# Patient Record
Sex: Female | Born: 1975 | Race: White | Hispanic: Yes | Marital: Married | Smoking: Never smoker
Health system: Southern US, Community
[De-identification: ages and names within clinical notes are randomized; demographics above are authoritative.]

## PROBLEM LIST (undated history)

## (undated) DIAGNOSIS — E119 Type 2 diabetes mellitus without complications: Secondary | ICD-10-CM

## (undated) DIAGNOSIS — R000 Tachycardia, unspecified: Secondary | ICD-10-CM

## (undated) HISTORY — PX: CHOLECYSTECTOMY: SHX55

---

## 2017-06-26 ENCOUNTER — Emergency Department (HOSPITAL_BASED_OUTPATIENT_CLINIC_OR_DEPARTMENT_OTHER)
Admission: EM | Admit: 2017-06-26 | Discharge: 2017-06-26 | Disposition: A | Discharge status: Home / Self Care | Payer: No Typology Code available for payment source | Attending: Emergency Medicine | Admitting: Emergency Medicine

## 2017-06-26 ENCOUNTER — Emergency Department (HOSPITAL_BASED_OUTPATIENT_CLINIC_OR_DEPARTMENT_OTHER): Payer: No Typology Code available for payment source

## 2017-06-26 ENCOUNTER — Encounter (HOSPITAL_BASED_OUTPATIENT_CLINIC_OR_DEPARTMENT_OTHER): Payer: Self-pay | Admitting: *Deleted

## 2017-06-26 ENCOUNTER — Other Ambulatory Visit: Payer: Self-pay

## 2017-06-26 DIAGNOSIS — Z7984 Long term (current) use of oral hypoglycemic drugs: Secondary | ICD-10-CM | POA: Diagnosis not present

## 2017-06-26 DIAGNOSIS — I471 Supraventricular tachycardia: Secondary | ICD-10-CM

## 2017-06-26 DIAGNOSIS — R079 Chest pain, unspecified: Secondary | ICD-10-CM | POA: Insufficient documentation

## 2017-06-26 DIAGNOSIS — R002 Palpitations: Secondary | ICD-10-CM | POA: Diagnosis present

## 2017-06-26 DIAGNOSIS — R0602 Shortness of breath: Secondary | ICD-10-CM | POA: Diagnosis not present

## 2017-06-26 HISTORY — DX: Tachycardia, unspecified: R00.0

## 2017-06-26 LAB — COMPREHENSIVE METABOLIC PANEL
ALK PHOS: 49 U/L (ref 38–126)
ALT: 63 U/L — AB (ref 14–54)
AST: 41 U/L (ref 15–41)
Albumin: 4.3 g/dL (ref 3.5–5.0)
Anion gap: 8 (ref 5–15)
BILIRUBIN TOTAL: 0.9 mg/dL (ref 0.3–1.2)
BUN: 19 mg/dL (ref 6–20)
CHLORIDE: 107 mmol/L (ref 101–111)
CO2: 22 mmol/L (ref 22–32)
CREATININE: 0.86 mg/dL (ref 0.44–1.00)
Calcium: 9.3 mg/dL (ref 8.9–10.3)
GFR calc Af Amer: >60 mL/min (ref >60)
Glucose, Bld: 170 mg/dL — ABNORMAL HIGH (ref 65–99)
Potassium: 3.5 mmol/L (ref 3.5–5.1)
Sodium: 137 mmol/L (ref 135–145)
TOTAL PROTEIN: 7.5 g/dL (ref 6.5–8.1)

## 2017-06-26 LAB — CBC
HEMATOCRIT: 40.9 % (ref 36.0–46.0)
Hemoglobin: 14 g/dL (ref 12.0–15.0)
MCH: 28.8 pg (ref 26.0–34.0)
MCHC: 34.2 g/dL (ref 30.0–36.0)
MCV: 84.2 fL (ref 78.0–100.0)
Platelets: 361 10*3/uL (ref 150–400)
RBC: 4.86 MIL/uL (ref 3.87–5.11)
RDW: 12.5 % (ref 11.5–15.5)
WBC: 9.2 10*3/uL (ref 4.0–10.5)

## 2017-06-26 MED ORDER — SODIUM CHLORIDE 0.9 % IV BOLUS (SEPSIS)
1000.0000 mL | Freq: Once | INTRAVENOUS | Status: AC
  Administered 2017-06-26: 1000 mL via INTRAVENOUS

## 2017-06-26 NOTE — ED Notes (Signed)
Family at bedside. 

## 2017-06-26 NOTE — ED Notes (Signed)
ED Provider at bedside. 

## 2017-06-26 NOTE — ED Provider Notes (Signed)
[facility] EMERGENCY DEPARTMENT Provider Note   CSN: [id number] Arrival date & time: 06/26/17  2035     History   Chief Complaint Chief Complaint  Patient presents with  . Palpitations    HPI April Mcconnell is a 41 y.o. female.  HPI  41 year old female presents with palpitations.  This started around 5 PM tonight.  She was laying in bed when it started.  She had some chest pain shortness of breath for less than 45 minutes but it has resolved.  She feels the palpitations but denies any current dizziness or lightheadedness.  No recent change in medicines.  Denies any significant caffeine use and denies smoking.  She has a history of diabetes.  States about a month ago she had a similar episode but it resolved on its own.  No known history of an arrhythmia. Has been working out more than typical recently. Friend is an EMT and her BP was in 90s when she checked with HR 200s so she brought her in to the ED.  Past Medical History:  Diagnosis Date  . Tachycardia, unspecified     There are no active problems to display for this patient.   History reviewed. No pertinent surgical history.  OB History    No data available       Home Medications    Prior to Admission medications   Medication Sig Start Date End Date Taking? Authorizing Provider  Dulaglutide (TRULICITY [place]) Inject into the skin.   Yes [provider]  metFORMIN (GLUMETZA) 1000 MG (MOD) 24 hr tablet Take 1,000 mg by mouth 2 (two) times daily with a meal.   Yes [provider]    Family History History reviewed. No pertinent family history.  Social History Social History   Tobacco Use  . Smoking status: Never Smoker  . Smokeless tobacco: Never Used  Substance Use Topics  . Alcohol use: No    Frequency: Never  . Drug use: No     Allergies   Patient has no known allergies.   Review of Systems Review of Systems  Respiratory: Positive for shortness of breath.     Cardiovascular: Positive for chest pain and palpitations.  Gastrointestinal: Negative for vomiting.  Neurological: Positive for dizziness.  All other systems reviewed and are negative.    Physical Exam Updated Vital Signs BP (!) 97/58   Pulse 94   Temp (!) 97.5 F (36.4 C) (Oral)   Resp 18   Ht 5\' 5"  (1.651 m)   Wt 74.4 kg (164 lb)   LMP 05/25/2017   SpO2 99%   BMI 27.29 kg/m   Physical Exam  Constitutional: She is oriented to person, place, and time. She appears well-developed and well-nourished. No distress.  HENT:  Head: Normocephalic and atraumatic.  Right Ear: External ear normal.  Left Ear: External ear normal.  Nose: Nose normal.  Eyes: Right eye exhibits no discharge. Left eye exhibits no discharge.  Cardiovascular: Regular rhythm, normal heart sounds and intact distal pulses. Tachycardia present.  HR 200  Pulmonary/Chest: Effort normal and breath sounds normal.  Abdominal: Soft. There is no tenderness.  Neurological: She is alert and oriented to person, place, and time.  Skin: Skin is warm and dry. She is not diaphoretic.  Nursing note and vitals reviewed.    ED Treatments / Results  Labs (all labs ordered are listed, but only abnormal results are displayed) Labs Reviewed  COMPREHENSIVE METABOLIC PANEL - Abnormal; Notable for the following components:  Result Value   Glucose, Bld 170 (*)    ALT 63 (*)    All other components within normal limits  CBC    EKG  EKG Interpretation  Date/Time:  Wednesday June 26 2017 20:39:38 EST Ventricular Rate:  192 PR Interval:    QRS Duration: 92 QT Interval:  258 QTC Calculation: 462 R Axis:   77 Text Interpretation:  Supraventricular tachycardia ST depression, probably rate related Baseline wander in lead(s) V4 No old tracing to compare Confirmed by Pricilla LovelessGoldston, Sennie Borden [phone]([id number]) on 06/26/2017 8:49:08 PM       EKG Interpretation  Date/Time:  Wednesday June 26 2017 20:45:23 EST Ventricular Rate:   105 PR Interval:    QRS Duration: 92 QT Interval:  328 QTC Calculation: 434 R Axis:   90 Text Interpretation:  Sinus tachycardia Borderline right axis deviation SVT resolved Confirmed by Pricilla LovelessGoldston, Eirik Schueler [phone]([id number]) on 06/26/2017 9:11:16 PM        Radiology Dg Chest Portable 1 View  Result Date: 06/26/2017 CLINICAL DATA:  Pt c/o palpations, SOB  x 4 hrs   Hx tachycardia EXAM: PORTABLE CHEST 1 VIEW COMPARISON:  None. FINDINGS: The heart size and mediastinal contours are within normal limits. Both lungs are clear. The visualized skeletal structures are unremarkable. IMPRESSION: No active disease. Electronically Signed   By: Norva PavlovElizabeth  Brown M.D.   On: 06/26/2017 21:03    Procedures Procedures (including critical care time)  Medications Ordered in ED Medications  sodium chloride 0.9 % bolus 1,000 mL (0 mLs Intravenous Stopped 06/26/17 2205)     Initial Impression / Assessment and Plan / ED Course  I have reviewed the triage vital signs and the nursing notes.  Pertinent labs & imaging results that were available during my care of the patient were reviewed by me and considered in my medical decision making (see chart for details).     Patient presents with SVT.  She has symptomatic palpitations but no other current chest pain or shortness of breath or dizziness.  After the REVERT maneuver was performed, she converted to sinus rhythm.  She has stayed in sinus rhythm since and her palpitations have resolved.  Sounds like she probably had an episode of SVT about a month ago.  At this point, her blood pressures are low normal in the 90s.  She states this is typical for her.  Thus we will hold off on putting her on a beta-blocker and have her follow-up with cardiology.  Lab work unremarkable.  My suspicion for ACS or PE is very low.  Discharge with return precautions.  Final Clinical Impressions(s) / ED Diagnoses   Final diagnoses:  SVT (supraventricular tachycardia) [facility](HCC)    ED Discharge  Orders    None       Pricilla LovelessGoldston, Indiyah Paone, MD 06/26/17 [phone]

## 2017-06-26 NOTE — ED Notes (Signed)
Portable xray at bedside.

## 2017-06-26 NOTE — ED Triage Notes (Signed)
Pt c/o palpations, SOB  x 4 hrs

## 2019-02-01 ENCOUNTER — Other Ambulatory Visit: Payer: Self-pay

## 2019-02-01 ENCOUNTER — Emergency Department (HOSPITAL_BASED_OUTPATIENT_CLINIC_OR_DEPARTMENT_OTHER): Payer: No Typology Code available for payment source

## 2019-02-01 ENCOUNTER — Encounter (HOSPITAL_BASED_OUTPATIENT_CLINIC_OR_DEPARTMENT_OTHER): Payer: Self-pay | Admitting: Emergency Medicine

## 2019-02-01 ENCOUNTER — Emergency Department (HOSPITAL_BASED_OUTPATIENT_CLINIC_OR_DEPARTMENT_OTHER)
Admission: EM | Admit: 2019-02-01 | Discharge: 2019-02-01 | Disposition: A | Discharge status: Home / Self Care | Payer: No Typology Code available for payment source | Attending: Emergency Medicine | Admitting: Emergency Medicine

## 2019-02-01 DIAGNOSIS — Z79899 Other long term (current) drug therapy: Secondary | ICD-10-CM | POA: Insufficient documentation

## 2019-02-01 DIAGNOSIS — K8020 Calculus of gallbladder without cholecystitis without obstruction: Secondary | ICD-10-CM | POA: Insufficient documentation

## 2019-02-01 DIAGNOSIS — U071 COVID-19: Secondary | ICD-10-CM | POA: Diagnosis not present

## 2019-02-01 DIAGNOSIS — Z7984 Long term (current) use of oral hypoglycemic drugs: Secondary | ICD-10-CM | POA: Insufficient documentation

## 2019-02-01 DIAGNOSIS — E119 Type 2 diabetes mellitus without complications: Secondary | ICD-10-CM | POA: Insufficient documentation

## 2019-02-01 DIAGNOSIS — R1010 Upper abdominal pain, unspecified: Secondary | ICD-10-CM | POA: Diagnosis present

## 2019-02-01 HISTORY — DX: Type 2 diabetes mellitus without complications: E11.9

## 2019-02-01 LAB — URINALYSIS, ROUTINE W REFLEX MICROSCOPIC
Bilirubin Urine: NEGATIVE
Glucose, UA: NEGATIVE mg/dL
Ketones, ur: NEGATIVE mg/dL
Leukocytes,Ua: NEGATIVE
Nitrite: NEGATIVE
Protein, ur: NEGATIVE mg/dL
Specific Gravity, Urine: >1.03 — ABNORMAL HIGH (ref 1.005–1.030)
pH: 6 (ref 5.0–8.0)

## 2019-02-01 LAB — CBC WITH DIFFERENTIAL/PLATELET
Abs Immature Granulocytes: 0.02 10*3/uL (ref 0.00–0.07)
Basophils Absolute: 0 10*3/uL (ref 0.0–0.1)
Basophils Relative: 0 %
Eosinophils Absolute: 0.1 10*3/uL (ref 0.0–0.5)
Eosinophils Relative: 3 %
HCT: 40.7 % (ref 36.0–46.0)
Hemoglobin: 14.1 g/dL (ref 12.0–15.0)
Immature Granulocytes: 0 %
Lymphocytes Relative: 24 %
Lymphs Abs: 1.2 10*3/uL (ref 0.7–4.0)
MCH: 30.3 pg (ref 26.0–34.0)
MCHC: 34.6 g/dL (ref 30.0–36.0)
MCV: 87.3 fL (ref 80.0–100.0)
Monocytes Absolute: 0.6 10*3/uL (ref 0.1–1.0)
Monocytes Relative: 13 %
Neutro Abs: 3.1 10*3/uL (ref 1.7–7.7)
Neutrophils Relative %: 60 %
Platelets: 187 10*3/uL (ref 150–400)
RBC: 4.66 MIL/uL (ref 3.87–5.11)
RDW: 12 % (ref 11.5–15.5)
WBC: 5 10*3/uL (ref 4.0–10.5)
nRBC: 0 % (ref 0.0–0.2)

## 2019-02-01 LAB — COMPREHENSIVE METABOLIC PANEL
ALT: 86 U/L — ABNORMAL HIGH (ref 0–44)
AST: 43 U/L — ABNORMAL HIGH (ref 15–41)
Albumin: 4 g/dL (ref 3.5–5.0)
Alkaline Phosphatase: 55 U/L (ref 38–126)
Anion gap: 9 (ref 5–15)
BUN: 13 mg/dL (ref 6–20)
CO2: 24 mmol/L (ref 22–32)
Calcium: 8.6 mg/dL — ABNORMAL LOW (ref 8.9–10.3)
Chloride: 105 mmol/L (ref 98–111)
Creatinine, Ser: 0.68 mg/dL (ref 0.44–1.00)
GFR calc Af Amer: >60 mL/min (ref >60)
GFR calc non Af Amer: >60 mL/min (ref >60)
Glucose, Bld: 165 mg/dL — ABNORMAL HIGH (ref 70–99)
Potassium: 3.6 mmol/L (ref 3.5–5.1)
Sodium: 138 mmol/L (ref 135–145)
Total Bilirubin: 0.7 mg/dL (ref 0.3–1.2)
Total Protein: 6.8 g/dL (ref 6.5–8.1)

## 2019-02-01 LAB — URINALYSIS, MICROSCOPIC (REFLEX)

## 2019-02-01 LAB — PREGNANCY, URINE: Preg Test, Ur: NEGATIVE

## 2019-02-01 LAB — LIPASE, BLOOD: Lipase: 34 U/L (ref 11–51)

## 2019-02-01 LAB — SARS CORONAVIRUS 2 AG (30 MIN TAT): SARS Coronavirus 2 Ag: POSITIVE — AB

## 2019-02-01 MED ORDER — AMOXICILLIN-POT CLAVULANATE 875-125 MG PO TABS: 1.0000 | ORAL_TABLET | Freq: Two times a day (BID) | ORAL | 0 refills | Status: DC

## 2019-02-01 MED ORDER — ONDANSETRON 8 MG PO TBDP: 8.0000 mg | ORAL_TABLET | Freq: Three times a day (TID) | ORAL | 0 refills | Status: DC | PRN

## 2019-02-01 MED ORDER — HYDROCODONE-ACETAMINOPHEN 5-325 MG PO TABS: 1.0000 | ORAL_TABLET | ORAL | 0 refills | Status: DC | PRN

## 2019-02-01 MED ORDER — HYDROMORPHONE HCL 1 MG/ML IJ SOLN
1.0000 mg | Freq: Once | INTRAMUSCULAR | Status: AC
  Administered 2019-02-01: 1 mg via INTRAVENOUS
  Filled 2019-02-01: qty 1

## 2019-02-01 MED ORDER — MORPHINE SULFATE (PF) 4 MG/ML IV SOLN
4.0000 mg | Freq: Once | INTRAVENOUS | Status: AC
  Administered 2019-02-01: 4 mg via INTRAVENOUS
  Filled 2019-02-01: qty 1

## 2019-02-01 MED ORDER — SODIUM CHLORIDE 0.9 % IV SOLN
2.0000 g | Freq: Once | INTRAVENOUS | Status: AC
  Administered 2019-02-01: 11:00:00 2 g via INTRAVENOUS
  Filled 2019-02-01: qty 20

## 2019-02-01 MED ORDER — SODIUM CHLORIDE 0.9 % IV SOLN
INTRAVENOUS | Status: DC | PRN
  Administered 2019-02-01: 250 mL via INTRAVENOUS

## 2019-02-01 MED ORDER — ONDANSETRON HCL 4 MG/2ML IJ SOLN
4.0000 mg | Freq: Once | INTRAMUSCULAR | Status: AC
  Administered 2019-02-01: 4 mg via INTRAVENOUS
  Filled 2019-02-01: qty 2

## 2019-02-01 NOTE — ED Notes (Signed)
Informed MD of pain 8/10

## 2019-02-01 NOTE — ED Notes (Signed)
ED Provider at bedside. 

## 2019-02-01 NOTE — ED Notes (Signed)
Patient transported to Ultrasound 

## 2019-02-01 NOTE — ED Notes (Signed)
Awaiting US.

## 2019-02-01 NOTE — Discharge Instructions (Addendum)
Call the general surgery office for follow up  Return to the ER as needed for new or worsening symptoms  Take the medication as prescribed

## 2019-02-01 NOTE — ED Triage Notes (Signed)
RUQ pain x 3 hrs, radiates to epigastric area and back, with nausea. Ate sausage dog for supper last night .Denies urinary s/s

## 2019-02-01 NOTE — ED Provider Notes (Signed)
[facility] EMERGENCY DEPARTMENT Provider Note   CSN: [id number] Arrival date & time: 02/01/19  0810     History   Chief Complaint Chief Complaint  Patient presents with  . Abdominal Pain    HPI April Mcconnell is a 43 y.o. female.     HPI Patient is a 43 year old female presents the emergency department acute onset right upper quadrant abdominal pain with associated nausea vomiting which began approximately 5 AM this morning.  Patient states she had similar symptoms several weeks ago but no recurrent pain since then.  No fevers or chills.  Her pain is severe in severity and constant at this time.  She does report eating sausage last night which is atypical for her no recent cough or congestion.  No recent illness.   Past Medical History:  Diagnosis Date  . Diabetes mellitus without complication (HCC)   . Tachycardia, unspecified     There are no active problems to display for this patient.   History reviewed. No pertinent surgical history.   OB History   No obstetric history on file.      Home Medications    Prior to Admission medications   Medication Sig Start Date End Date Taking? Authorizing Provider  Dulaglutide (TRULICITY [place]) Inject into the skin.   Yes [provider]  metFORMIN (GLUMETZA) 1000 MG (MOD) 24 hr tablet Take 1,000 mg by mouth 2 (two) times daily with a meal.   Yes [provider]  amoxicillin-clavulanate (AUGMENTIN) 875-125 MG tablet Take 1 tablet by mouth every 12 (twelve) hours. 02/01/19   Azalia Bilisampos, Nylani Michetti, MD  HYDROcodone-acetaminophen (NORCO/VICODIN) 5-325 MG tablet Take 1 tablet by mouth every 4 (four) hours as needed for moderate pain. 02/01/19   Azalia Bilisampos, Myasia Sinatra, MD  ondansetron (ZOFRAN ODT) 8 MG disintegrating tablet Take 1 tablet (8 mg total) by mouth every 8 (eight) hours as needed for nausea or vomiting. 02/01/19   Azalia Bilisampos, Amayrany Cafaro, MD    Family History No family history on file.  Social History Social History   Tobacco Use  . Smoking status: Never Smoker  . Smokeless tobacco: Never Used  Substance Use Topics  . Alcohol use: No    Frequency: Never  . Drug use: No     Allergies   Patient has no known allergies.   Review of Systems Review of Systems  All other systems reviewed and are negative.    Physical Exam Updated Vital Signs BP 104/74 (BP Location: Right Arm)   Pulse 77   Temp 98.2 F (36.8 C) (Oral)   Resp 18   Ht 5\' 4"  (1.626 m)   Wt 60.3 kg   LMP 01/05/2019   SpO2 100%   BMI 22.83 kg/m   Physical Exam Vitals signs and nursing note reviewed.  Constitutional:      Appearance: She is well-developed.     Comments: Uncomfortable appearing  HENT:     Head: Normocephalic and atraumatic.  Neck:     Musculoskeletal: Normal range of motion.  Cardiovascular:     Rate and Rhythm: Normal rate and regular rhythm.     Heart sounds: Normal heart sounds.  Pulmonary:     Effort: Pulmonary effort is normal.     Breath sounds: Normal breath sounds.  Abdominal:     General: There is no distension.     Palpations: Abdomen is soft.     Tenderness: There is no abdominal tenderness.  Musculoskeletal: Normal range of motion.  Skin:    General: Skin  is warm and dry.  Neurological:     Mental Status: She is alert and oriented to person, place, and time.  Psychiatric:        Judgment: Judgment normal.      ED Treatments / Results  Labs (all labs ordered are listed, but only abnormal results are displayed) Labs Reviewed  SARS CORONAVIRUS 2 (HOSP ORDER, PERFORMED IN [facility] VIA [facility] ID) - Abnormal; Notable for the following components:      Result Value   SARS Coronavirus 2 ([facility] ID Now) POSITIVE (*)    All other components within normal limits  URINALYSIS, ROUTINE W REFLEX MICROSCOPIC - Abnormal; Notable for the following components:   Specific Gravity, Urine >1.030 (*)    Hgb urine dipstick TRACE (*)    All other components within normal limits   COMPREHENSIVE METABOLIC PANEL - Abnormal; Notable for the following components:   Glucose, Bld 165 (*)    Calcium 8.6 (*)    AST 43 (*)    ALT 86 (*)    All other components within normal limits  URINALYSIS, MICROSCOPIC (REFLEX) - Abnormal; Notable for the following components:   Bacteria, UA RARE (*)    All other components within normal limits  PREGNANCY, URINE  CBC WITH DIFFERENTIAL/PLATELET  LIPASE, BLOOD    EKG None  Radiology [place] Abdomen Limited Ruq  Result Date: 02/01/2019 CLINICAL DATA:  Acute epigastric abdominal pain. EXAM: ULTRASOUND ABDOMEN LIMITED RIGHT UPPER QUADRANT COMPARISON:  None. FINDINGS: Gallbladder: Cholelithiasis is noted with largest stone measuring 1.7 cm. No gallbladder wall thickening is noted. Small amount of pericholecystic fluid is noted. Positive sonographic Murphy's sign is noted. Mild amount of sludge is noted within the gallbladder lumen. Probable 1.8 cm polyp is noted which demonstrates color flow. Common bile duct: Diameter: 2 mm which is within normal limits. Liver: No focal lesion identified. Increased echogenicity of hepatic parenchyma is noted suggesting hepatic steatosis. Portal vein is patent on color Doppler imaging with normal direction of blood flow towards the liver. IMPRESSION: Cholelithiasis is noted as well as sludge with small amount of pericholecystic fluid and positive sonographic Murphy's sign. This is concerning for possible cholecystitis, and HIDA scan may be performed for further evaluation. Probable 1.8 cm polyp is noted which demonstrates color flow. Given the size, malignancy cannot be excluded and correlation with surgery is recommended. Probable hepatic steatosis. Electronically Signed   By: Lupita RaiderJames  Green Jr M.D.   On: 02/01/2019 10:13    Procedures Procedures (including critical care time)  Medications Ordered in ED Medications  cefTRIAXone (ROCEPHIN) 2 g in sodium chloride 0.9 % 100 mL IVPB (2 g Intravenous New Bag/Given 02/01/19  1039)  0.9 %  sodium chloride infusion (250 mLs Intravenous New Bag/Given 02/01/19 1038)  ondansetron (ZOFRAN) injection 4 mg (4 mg Intravenous Given 02/01/19 0846)  morphine 4 MG/ML injection 4 mg (4 mg Intravenous Given 02/01/19 0847)  HYDROmorphone (DILAUDID) injection 1 mg (1 mg Intravenous Given 02/01/19 0919)     Initial Impression / Assessment and Plan / ED Course  I have reviewed the triage vital signs and the nursing notes.  Pertinent labs & imaging results that were available during my care of the patient were reviewed by me and considered in my medical decision making (see chart for details).       Resolution of patient's pain here in the emergency department.  Her symptoms are more consistent with biliary colic with noted gallstones.  There is a very small amount of  pericholecystic fluid without gallbladder wall thickening.  Common bile duct is normal.  Mild elevation in her LFTs.  Case was discussed with central surgery Dr. Ninfa Linden who agrees the patient is able to be followed up closely in the office this week and does recommend initiation of Augmentin.  No frank right upper quadrant tenderness on exam at this time.  No Murphy sign on my examination.  Preemptive COVID-19 swab obtained  11:06 AM I was informed from nursing staff that the patient is positive for corona virus here in the emergency department.  She likely has an asymptomatic carrier as she has no fever or upper respiratory symptoms.  She has been given home quarantine instructions for 14 days.  She is been instructed to call the [place] surgery office tomorrow for close follow-up regarding her gallstones but also to let them know that she has tested positive for corona virus.  She understands that this may delay her follow-up.  She understands in the interim to return to emergency department for new or worsening symptoms and to inform them of her positive COVID-19 test    Final Clinical Impressions(s) / ED  Diagnoses   Final diagnoses:  Upper abdominal pain  Calculus of gallbladder without cholecystitis without obstruction  COVID-19 virus detected    ED Discharge Orders         Ordered    ondansetron (ZOFRAN ODT) 8 MG disintegrating tablet  Every 8 hours PRN     02/01/19 1048    amoxicillin-clavulanate (AUGMENTIN) 875-125 MG tablet  Every 12 hours     02/01/19 1048    HYDROcodone-acetaminophen (NORCO/VICODIN) 5-325 MG tablet  Every 4 hours PRN     02/01/19 1048           Jola Schmidt, MD 02/01/19 1107

## 2019-03-15 ENCOUNTER — Encounter (HOSPITAL_COMMUNITY): Payer: Self-pay | Admitting: Emergency Medicine

## 2019-03-15 ENCOUNTER — Emergency Department (HOSPITAL_COMMUNITY)
Admission: EM | Admit: 2019-03-15 | Discharge: 2019-03-15 | Disposition: A | Discharge status: Home / Self Care | Payer: Medicare (Managed Care) | Attending: Emergency Medicine | Admitting: Emergency Medicine

## 2019-03-15 ENCOUNTER — Other Ambulatory Visit: Payer: Self-pay

## 2019-03-15 ENCOUNTER — Emergency Department (HOSPITAL_COMMUNITY): Payer: Medicare (Managed Care)

## 2019-03-15 DIAGNOSIS — Z7984 Long term (current) use of oral hypoglycemic drugs: Secondary | ICD-10-CM | POA: Insufficient documentation

## 2019-03-15 DIAGNOSIS — Z79899 Other long term (current) drug therapy: Secondary | ICD-10-CM | POA: Diagnosis not present

## 2019-03-15 DIAGNOSIS — Z8619 Personal history of other infectious and parasitic diseases: Secondary | ICD-10-CM | POA: Diagnosis not present

## 2019-03-15 DIAGNOSIS — E119 Type 2 diabetes mellitus without complications: Secondary | ICD-10-CM | POA: Diagnosis not present

## 2019-03-15 DIAGNOSIS — R1011 Right upper quadrant pain: Secondary | ICD-10-CM

## 2019-03-15 DIAGNOSIS — K8050 Calculus of bile duct without cholangitis or cholecystitis without obstruction: Secondary | ICD-10-CM | POA: Insufficient documentation

## 2019-03-15 LAB — URINALYSIS, ROUTINE W REFLEX MICROSCOPIC
Bilirubin Urine: NEGATIVE
Glucose, UA: NEGATIVE mg/dL
Hgb urine dipstick: NEGATIVE
Ketones, ur: NEGATIVE mg/dL
Leukocytes,Ua: NEGATIVE
Nitrite: NEGATIVE
Protein, ur: NEGATIVE mg/dL
Specific Gravity, Urine: 1.018 (ref 1.005–1.030)
pH: 6 (ref 5.0–8.0)

## 2019-03-15 LAB — CBC
HCT: 39.9 % (ref 36.0–46.0)
Hemoglobin: 14.1 g/dL (ref 12.0–15.0)
MCH: 30.5 pg (ref 26.0–34.0)
MCHC: 35.3 g/dL (ref 30.0–36.0)
MCV: 86.2 fL (ref 80.0–100.0)
Platelets: 312 10*3/uL (ref 150–400)
RBC: 4.63 MIL/uL (ref 3.87–5.11)
RDW: 12.2 % (ref 11.5–15.5)
WBC: 6 10*3/uL (ref 4.0–10.5)
nRBC: 0 % (ref 0.0–0.2)

## 2019-03-15 LAB — COMPREHENSIVE METABOLIC PANEL
ALT: 60 U/L — ABNORMAL HIGH (ref 0–44)
AST: 30 U/L (ref 15–41)
Albumin: 3.9 g/dL (ref 3.5–5.0)
Alkaline Phosphatase: 49 U/L (ref 38–126)
Anion gap: 9 (ref 5–15)
BUN: 9 mg/dL (ref 6–20)
CO2: 26 mmol/L (ref 22–32)
Calcium: 9.1 mg/dL (ref 8.9–10.3)
Chloride: 102 mmol/L (ref 98–111)
Creatinine, Ser: 0.77 mg/dL (ref 0.44–1.00)
GFR calc Af Amer: >60 mL/min (ref >60)
GFR calc non Af Amer: >60 mL/min (ref >60)
Glucose, Bld: 161 mg/dL — ABNORMAL HIGH (ref 70–99)
Potassium: 3.5 mmol/L (ref 3.5–5.1)
Sodium: 137 mmol/L (ref 135–145)
Total Bilirubin: 0.8 mg/dL (ref 0.3–1.2)
Total Protein: 6.8 g/dL (ref 6.5–8.1)

## 2019-03-15 LAB — I-STAT BETA HCG BLOOD, ED (MC, WL, AP ONLY): I-stat hCG, quantitative: <5 m[IU]/mL (ref <5)

## 2019-03-15 LAB — LIPASE, BLOOD: Lipase: 33 U/L (ref 11–51)

## 2019-03-15 MED ORDER — OXYCODONE-ACETAMINOPHEN 5-325 MG PO TABS: 1.0000 | ORAL_TABLET | Freq: Four times a day (QID) | ORAL | 0 refills | Status: DC | PRN

## 2019-03-15 MED ORDER — ONDANSETRON 4 MG PO TBDP: 4.0000 mg | ORAL_TABLET | Freq: Three times a day (TID) | ORAL | 0 refills | Status: DC | PRN

## 2019-03-15 MED ORDER — ONDANSETRON HCL 4 MG/2ML IJ SOLN
4.0000 mg | Freq: Once | INTRAMUSCULAR | Status: AC
  Administered 2019-03-15: 03:00:00 4 mg via INTRAVENOUS
  Filled 2019-03-15: qty 2

## 2019-03-15 MED ORDER — ONDANSETRON 4 MG PO TBDP
4.0000 mg | ORAL_TABLET | Freq: Once | ORAL | Status: AC
  Administered 2019-03-15: 02:00:00 4 mg via ORAL
  Filled 2019-03-15: qty 1

## 2019-03-15 MED ORDER — SODIUM CHLORIDE 0.9% FLUSH: 3.0000 mL | Freq: Once | INTRAVENOUS | Status: DC

## 2019-03-15 MED ORDER — MORPHINE SULFATE (PF) 4 MG/ML IV SOLN
4.0000 mg | Freq: Once | INTRAVENOUS | Status: AC
  Administered 2019-03-15: 03:00:00 4 mg via INTRAVENOUS
  Filled 2019-03-15: qty 1

## 2019-03-15 NOTE — ED Triage Notes (Signed)
Pt to ED with right upper quad pain with nausea and vomiting.  Pt st's she was scheduled to have gallbladder removed but covid test came back positive on 02/01/2019   Pt was told she had to have 2 neg test before they could reschedule her surgery.  Pt st's she now has had two neg test but has not rescheduled her surg. Yet.

## 2019-03-15 NOTE — Discharge Instructions (Addendum)
You were seen today and are continuing to have gallbladder issues.  Follow-up with general surgery as you likely need to have your gallbladder taken out.  Take medications as prescribed.  If you develop worsening pain, fevers, inability to tolerate food or fluids, you need to be reevaluated.

## 2019-03-15 NOTE — ED Provider Notes (Signed)
[facility] Provider [facility]   CSN: [id number] Arrival date & time: 03/15/19  0016     History   Chief Complaint Chief Complaint  Patient presents with  . Abdominal Pain    HPI April Mcconnell is a 43 y.o. female.     HPI  This is a 43 year old female with history of diabetes and gallbladder disease who presents with right upper quadrant pain.  Patient reports progressively worsening right upper quadrant pain.  It is associated with eating.  She states that there is not much that she can eat without having pain.  She reports her pain today is 8 out of 10.  She reports nausea without vomiting.  She took Percocet with minimal relief.  Reports normal bowel movements.  No fevers.  No known sick contacts.  Of note, she was found to have gallstones in July.  However, she incidentally tested positive for coronavirus.  This is delayed her follow-up.  She reports that she has since had 2 neg coronavirus tests.  Past Medical History:  Diagnosis Date  . Diabetes mellitus without complication (HCC)   . Tachycardia, unspecified     There are no active problems to display for this patient.   History reviewed. No pertinent surgical history.   OB History   No obstetric history on file.      Home Medications    Prior to Admission medications   Medication Sig Start Date End Date Taking? Authorizing Provider  amoxicillin-clavulanate (AUGMENTIN) 875-125 MG tablet Take 1 tablet by mouth every 12 (twelve) hours. 02/01/19   Azalia Bilisampos, Kevin, MD  Dulaglutide (TRULICITY [place]) Inject into the skin.    [provider]  HYDROcodone-acetaminophen (NORCO/VICODIN) 5-325 MG tablet Take 1 tablet by mouth every 4 (four) hours as needed for moderate pain. 02/01/19   Azalia Bilisampos, Kevin, MD  metFORMIN (GLUMETZA) 1000 MG (MOD) 24 hr tablet Take 1,000 mg by mouth 2 (two) times daily with a meal.    [provider]  ondansetron (ZOFRAN ODT) 4 MG disintegrating  tablet Take 1 tablet (4 mg total) by mouth every 8 (eight) hours as needed for nausea or vomiting. 03/15/19   Horton, Mayer Maskerourtney F, MD  ondansetron (ZOFRAN ODT) 8 MG disintegrating tablet Take 1 tablet (8 mg total) by mouth every 8 (eight) hours as needed for nausea or vomiting. 02/01/19   Azalia Bilisampos, Kevin, MD  oxyCODONE-acetaminophen (PERCOCET/ROXICET) 5-325 MG tablet Take 1 tablet by mouth every 6 (six) hours as needed for severe pain. 03/15/19   Horton, Mayer Maskerourtney F, MD    Family History No family history on file.  Social History Social History   Tobacco Use  . Smoking status: Never Smoker  . Smokeless tobacco: Never Used  Substance Use Topics  . Alcohol use: No    Frequency: Never  . Drug use: No     Allergies   Patient has no known allergies.   Review of Systems Review of Systems  Constitutional: Negative for fever.  Respiratory: Negative for shortness of breath.   Cardiovascular: Negative for chest pain.  Gastrointestinal: Positive for abdominal pain and nausea. Negative for constipation, diarrhea and vomiting.  Genitourinary: Negative for dysuria.  Neurological: Negative for numbness.  All other systems reviewed and are negative.    Physical Exam Updated Vital Signs BP 117/81   Pulse 69   Temp 98.2 F (36.8 C) (Oral)   Resp 16   Ht 1.626 m (5\' 4" )   Wt 77.6 kg   LMP 03/14/2019 (Exact  Date)   SpO2 98%   BMI 29.35 kg/m   Physical Exam Vitals signs and nursing note reviewed.  Constitutional:      Appearance: She is well-developed.     Comments: Overweight, nontoxic-appearing  HENT:     Head: Normocephalic and atraumatic.  Eyes:     Pupils: Pupils are equal, round, and reactive to light.  Neck:     Musculoskeletal: Neck supple.  Cardiovascular:     Rate and Rhythm: Normal rate and regular rhythm.     Heart sounds: Normal heart sounds.  Pulmonary:     Effort: Pulmonary effort is normal. No respiratory distress.     Breath sounds: No wheezing.  Abdominal:      General: Bowel sounds are normal.     Palpations: Abdomen is soft.     Tenderness: There is abdominal tenderness in the right upper quadrant. There is no guarding or rebound. Negative signs include Murphy's sign.  Skin:    General: Skin is warm and dry.  Neurological:     Mental Status: She is alert and oriented to person, place, and time.  Psychiatric:        Mood and Affect: Mood normal.      ED Treatments / Results  Labs (all labs ordered are listed, but only abnormal results are displayed) Labs Reviewed  COMPREHENSIVE METABOLIC PANEL - Abnormal; Notable for the following components:      Result Value   Glucose, Bld 161 (*)    ALT 60 (*)    All other components within normal limits  LIPASE, BLOOD  CBC  URINALYSIS, ROUTINE W REFLEX MICROSCOPIC  I-STAT BETA HCG BLOOD, ED ([facility], [facility], [facility] ONLY)    EKG None  Radiology US Abdomen Limited Ruq  Result Date: 03/15/2019 CLINICAL DATA:  43 year old female with right upper quadrant abdominal pain. History of gallstones. EXAM: ULTRASOUND ABDOMEN LIMITED RIGHT UPPER QUADRANT COMPARISON:  Abdominal ultrasound dated 02/01/2019 FINDINGS: Gallbladder: There is sludge and a 2 cm stone in the gallbladder. Gallbladder wall is minimally thickened measuring 4 mm. There is trace pericholecystic fluid. Negative sonographic Murphy's sign. Evaluation for tenderness however is limited as the patient was pre-medicated. The previously seen polyp is not visualized on today's exam. Common bile duct: Diameter: 3 mm Liver: There is diffuse increased liver echogenicity most consistent with fatty infiltration. Superimposed inflammation or fibrosis is not excluded. Clinical correlation is recommended. Portal vein is patent on color Doppler imaging with normal direction of blood flow towards the liver. Other: None. IMPRESSION: 1. Cholelithiasis with findings of possible acute cholecystitis. A hepatobiliary scintigraphy may provide better evaluation of the gallbladder if  there is a high clinical concern for acute cholecystitis . 2. Fatty liver. Electronically Signed   By: Anner Crete M.D.   On: 03/15/2019 03:28    Procedures Procedures (including critical care time)  Medications Ordered in ED Medications  sodium chloride flush (NS) 0.9 % injection 3 mL (has no administration in time range)  ondansetron (ZOFRAN-ODT) disintegrating tablet 4 mg (4 mg Oral Given 03/15/19 0138)  morphine 4 MG/ML injection 4 mg (4 mg Intravenous Given 03/15/19 0243)  ondansetron (ZOFRAN) injection 4 mg (4 mg Intravenous Given 03/15/19 0243)     Initial Impression / Assessment and Plan / ED Course  I have reviewed the triage vital signs and the nursing notes.  Pertinent labs & imaging results that were available during my care of the patient were reviewed by me and considered in my medical decision making (see chart  for details).        Patient presents with persistent and worsening right upper quadrant pain.  She is overall nontoxic-appearing and vital signs are reassuring.  She is afebrile.  No signs of peritonitis on exam.  Lab work obtained.  No leukocytosis.  LFTs are normal.  Patient was given morphine and Zofran.  Repeat ultrasound obtained and shows cholelithiasis.  She does have some minimal thickening with trace fluid which is consistent with her prior ultrasound.  Negative Murphy sign.  On repeat evaluation she states she feels much better.  I have recommended ongoing symptom control and close follow-up with [facility] [place] [facility].  Given that she now has tested negative for coronavirus, she likely can have her surgery scheduled.  After history, exam, and medical workup I feel the patient has been appropriately medically screened and is safe for discharge home. Pertinent diagnoses were discussed with the patient. Patient was given return precautions.   Final Clinical Impressions(s) / ED Diagnoses   Final diagnoses:  RUQ pain  Biliary colic    ED Discharge  Orders         Ordered    oxyCODONE-acetaminophen (PERCOCET/ROXICET) 5-325 MG tablet  Every 6 hours PRN     03/15/19 0457    ondansetron (ZOFRAN ODT) 4 MG disintegrating tablet  Every 8 hours PRN     03/15/19 0457           Horton, Mayer Maskerourtney F, MD 03/15/19 0502

## 2019-03-15 NOTE — ED Notes (Signed)
Pt heaving, Zofran given

## 2019-03-15 NOTE — ED Notes (Signed)
Pt given water and ginger ale for PO challenge. 

## 2019-03-15 NOTE — ED Notes (Signed)
Patient verbalizes understanding of discharge instructions. Opportunity for questioning and answers were provided. Armband removed by staff, pt discharged from ED ambulatory.   

## 2020-08-18 IMAGING — US ULTRASOUND ABDOMEN LIMITED
1 series · 13 of 25 positions shown · non-contrast
Comparison: None.

CLINICAL DATA: Acute epigastric abdominal pain.

EXAM:
ULTRASOUND ABDOMEN LIMITED RIGHT UPPER QUADRANT

[Series 1: ultrasound abdomen limited · 13 of 52 slices shown]
[im 1/52]
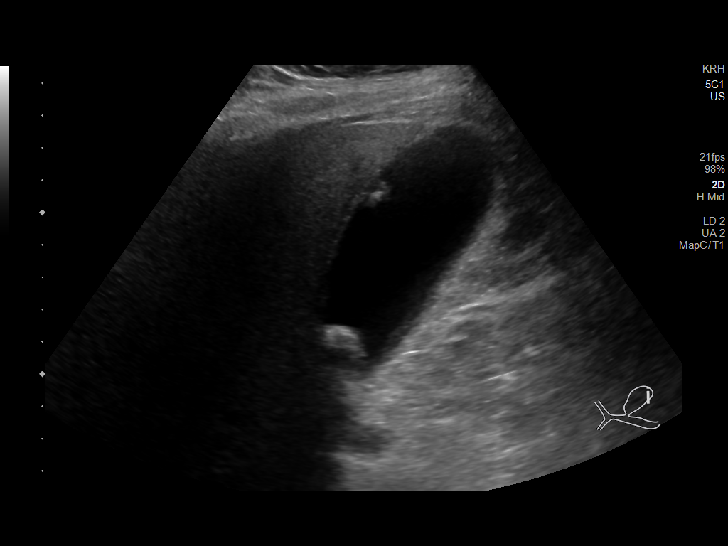
[im 5/52]
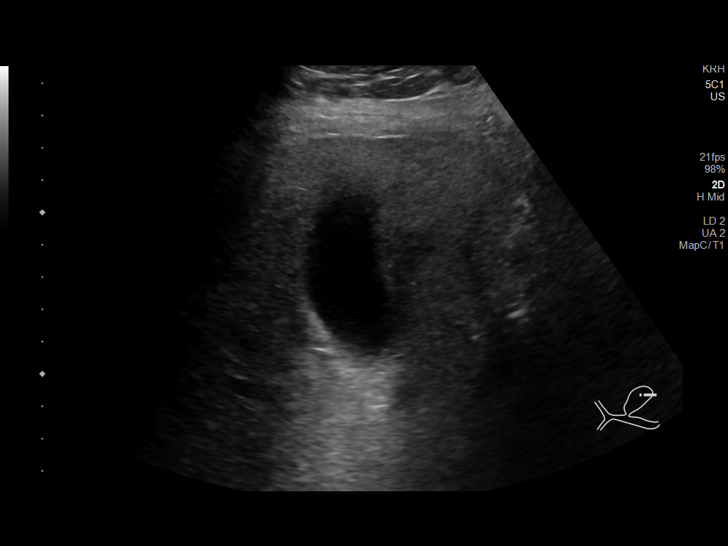
[im 9/52]
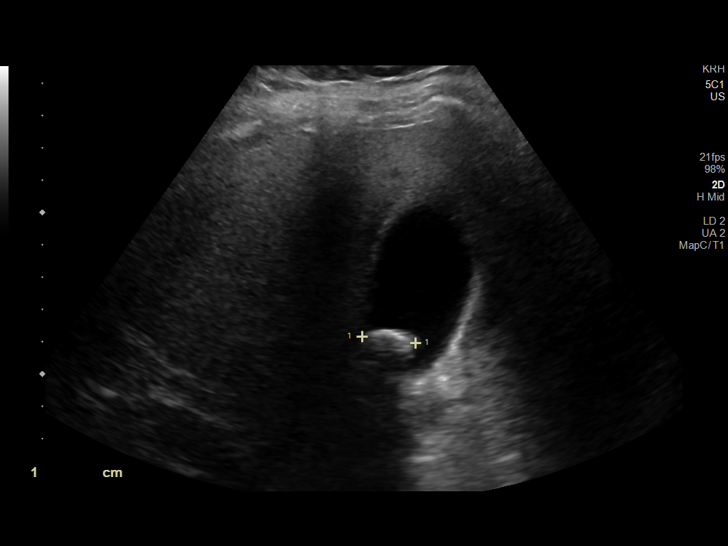
[im 13/52]
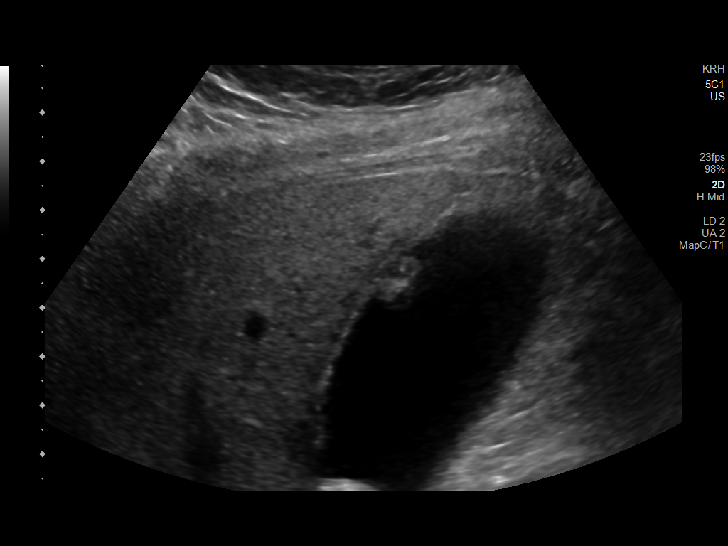
[im 18/52]
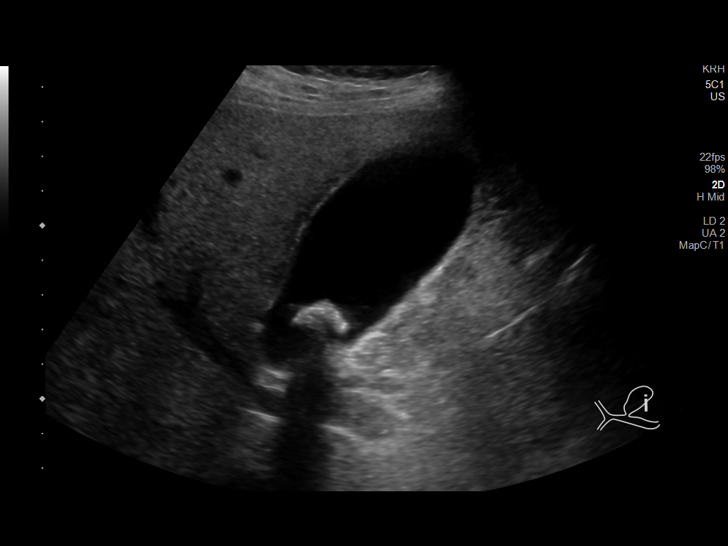
[im 22/52]
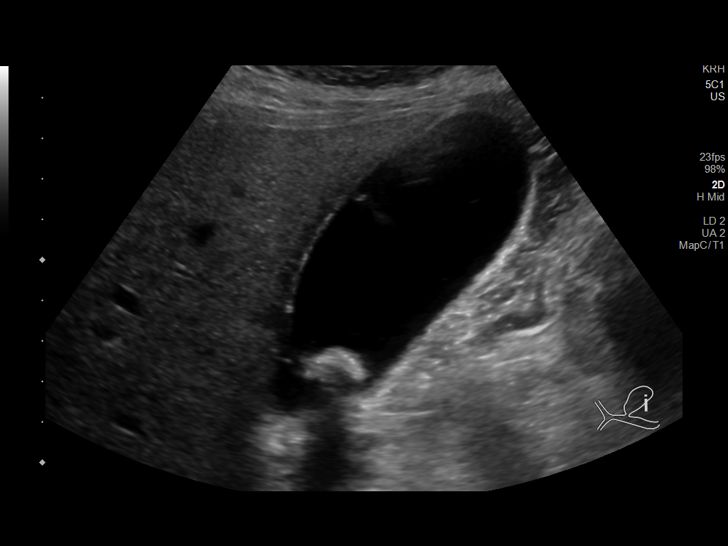
[im 26/52]
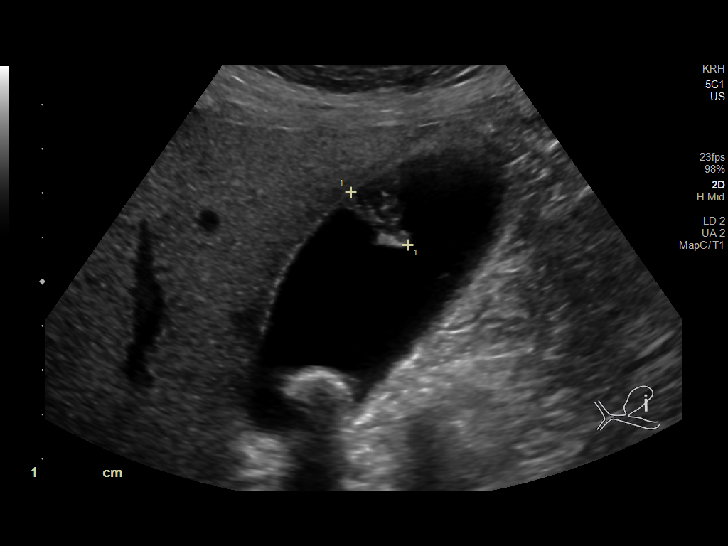
[im 30/52]
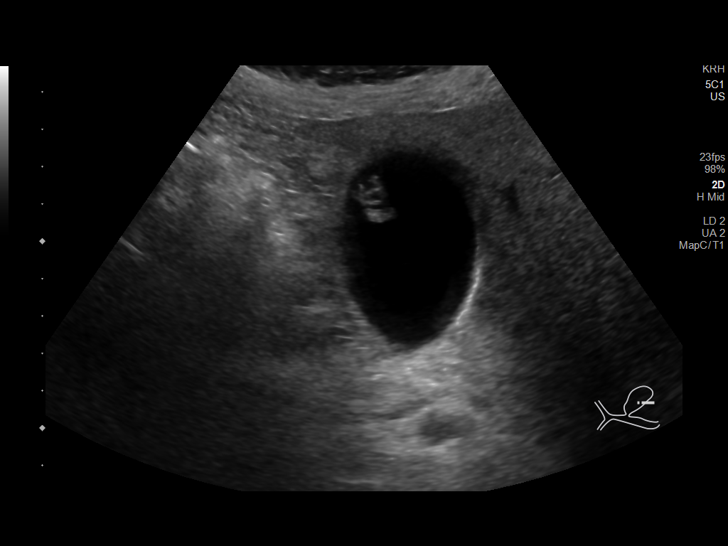
[im 35/52]
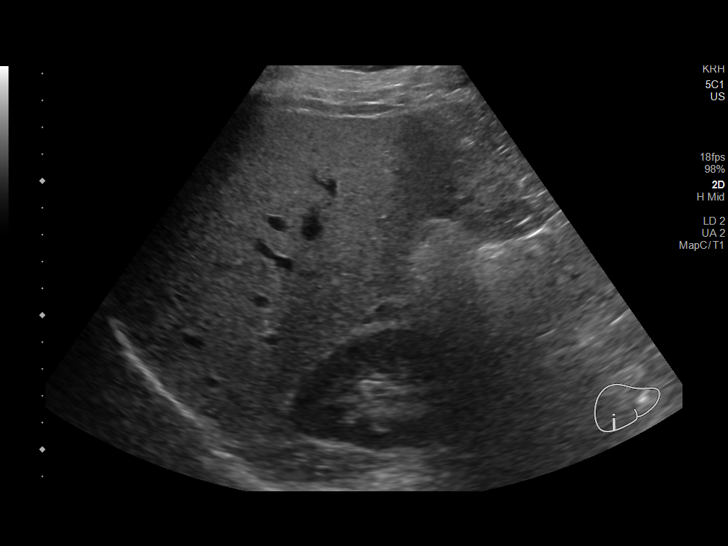
[im 39/52]
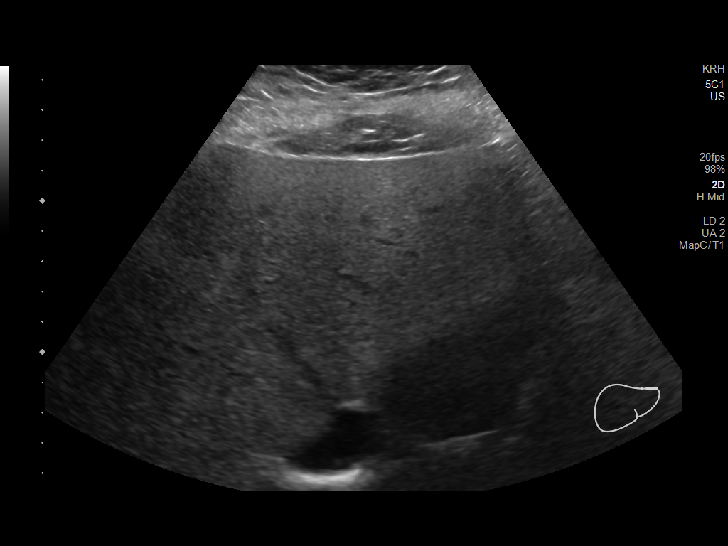
[im 43/52]
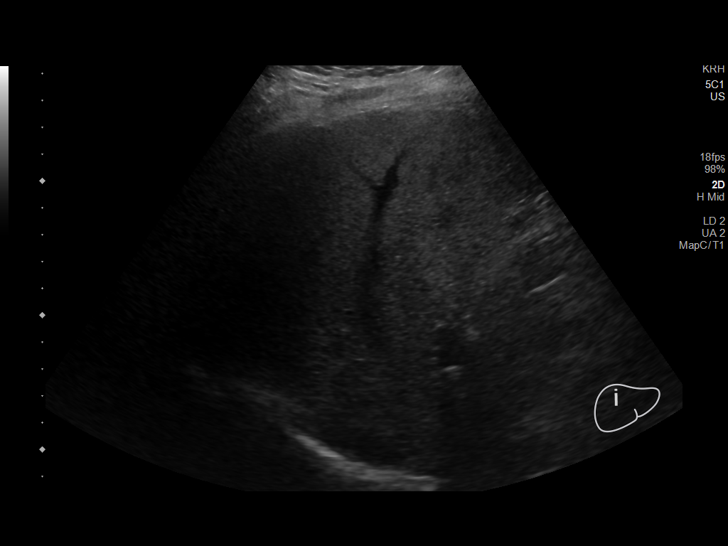
[im 47/52]
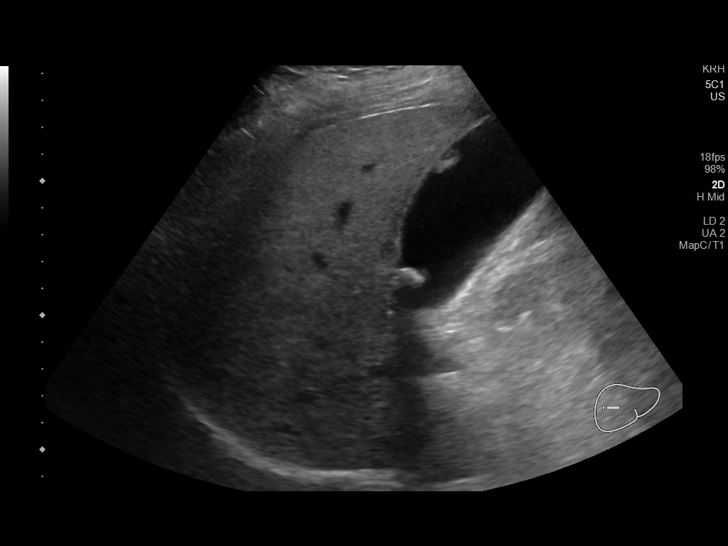
[im 52/52]
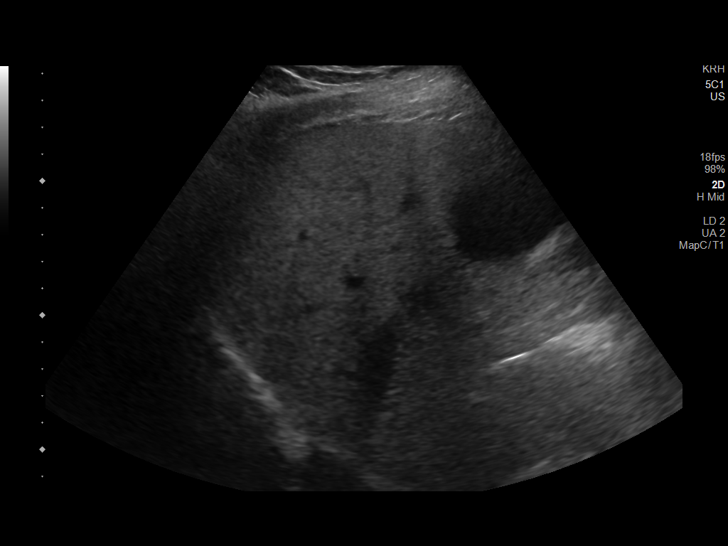

[13 of 25 positions shown; findings below may reference images not displayed]

FINDINGS: Gallbladder:

Cholelithiasis is noted with largest stone measuring 1.7 cm. No
gallbladder wall thickening is noted. Small amount of
pericholecystic fluid is noted. Positive sonographic Murphy's sign
is noted. Mild amount of sludge is noted within the gallbladder
lumen. Probable 1.8 cm polyp is noted which demonstrates color flow.

Common bile duct:

Diameter: 2 mm which is within normal limits.

Liver:

No focal lesion identified. Increased echogenicity of hepatic
parenchyma is noted suggesting hepatic steatosis. Portal vein is
patent on color Doppler imaging with normal direction of blood flow
towards the liver.
IMPRESSION: Cholelithiasis is noted as well as sludge with small amount of
pericholecystic fluid and positive sonographic Murphy's sign. This
is concerning for possible cholecystitis, and HIDA scan may be
performed for further evaluation.

Probable 1.8 cm polyp is noted which demonstrates color flow. Given
the size, malignancy cannot be excluded and correlation with surgery
is recommended.

Probable hepatic steatosis.

## 2020-09-29 IMAGING — US ULTRASOUND ABDOMEN LIMITED
1 series · 14 of 25 positions shown · non-contrast
Comparison: Abdominal ultrasound dated 02/01/2019

CLINICAL DATA: 42-year-old female with right upper quadrant
abdominal pain. History of gallstones.

EXAM:
ULTRASOUND ABDOMEN LIMITED RIGHT UPPER QUADRANT

[Series 1: ultrasound abdomen limited · 14 of 61 slices shown]
[im 1/61]
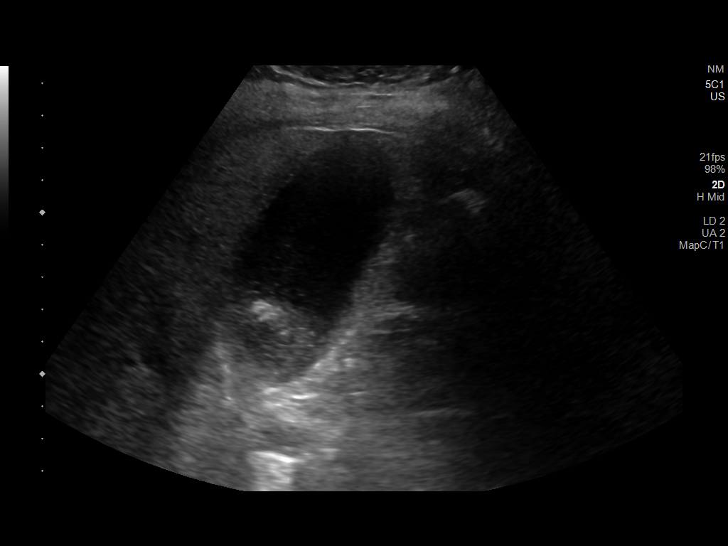
[im 6/61]
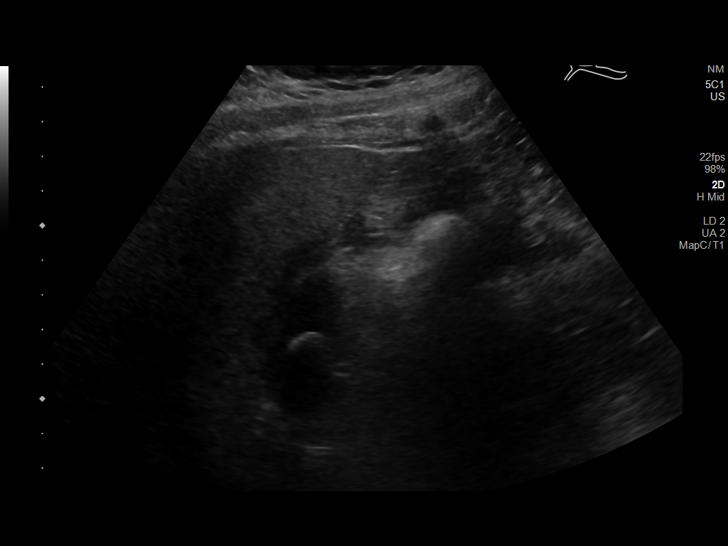
[im 11/61]
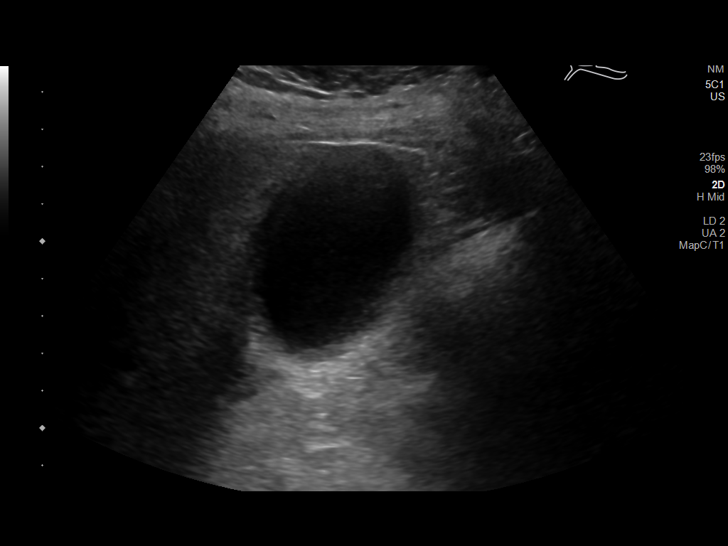
[im 16/61]
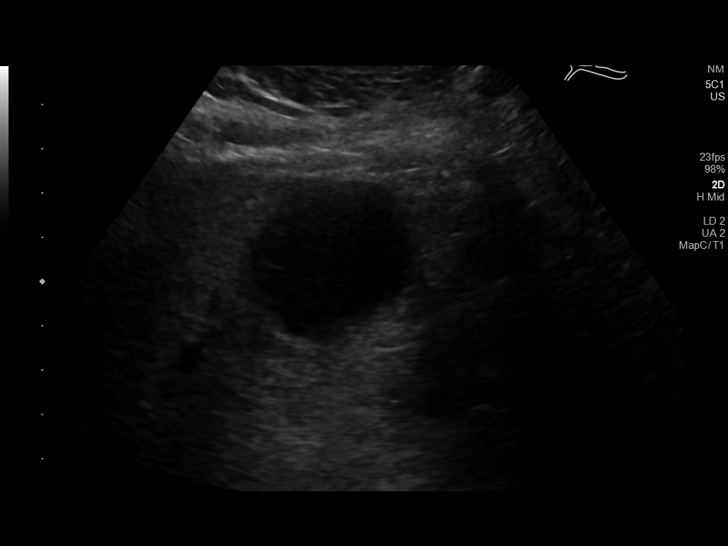
[im 21/61]
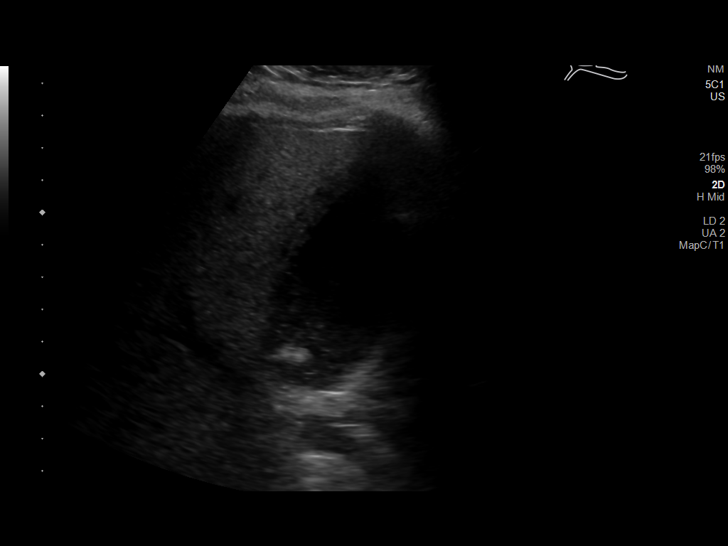
[im 23/61]
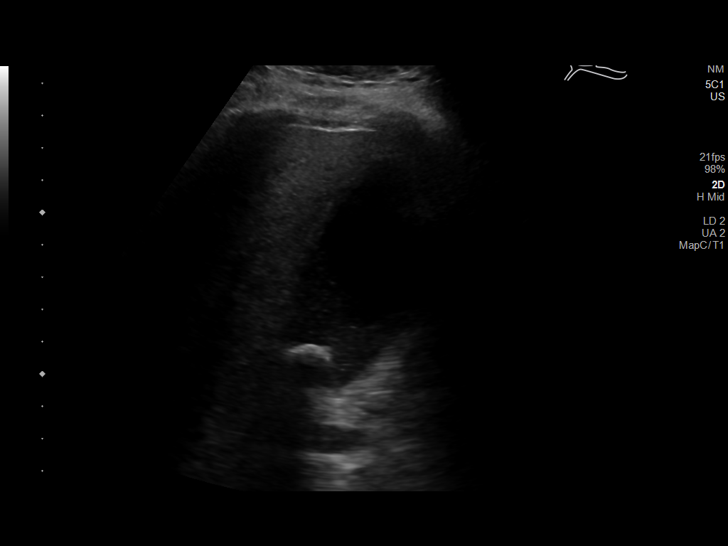
[im 28/61]
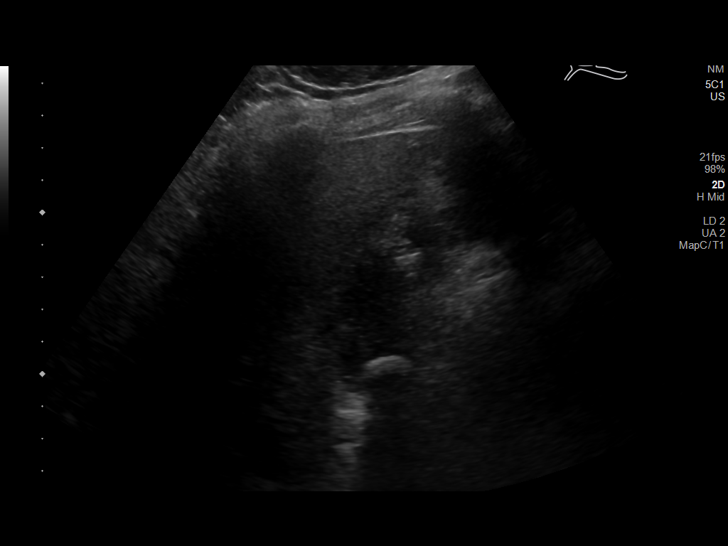
[im 33/61]
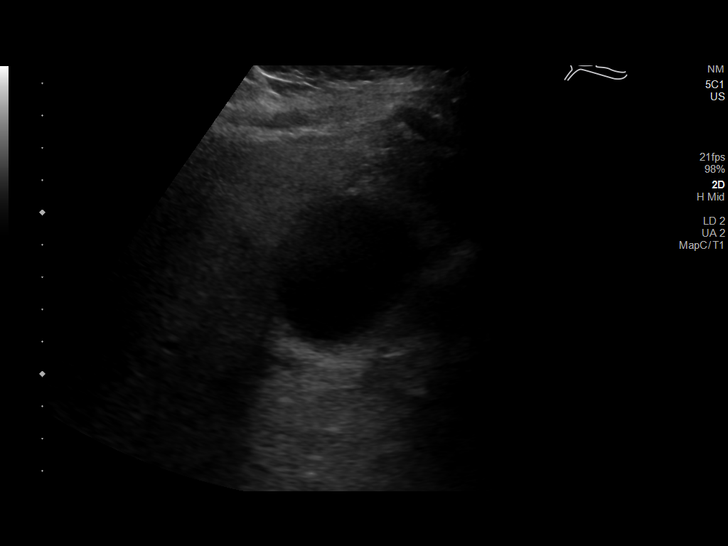
[im 38/61]
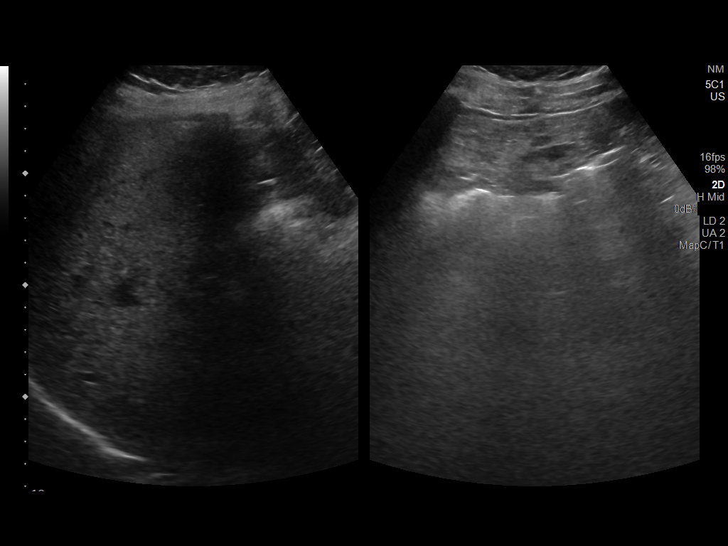
[im 41/61]
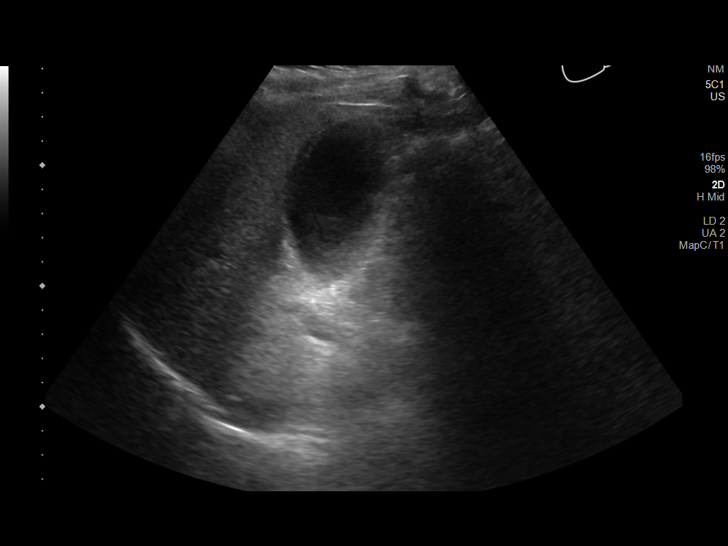
[im 46/61]
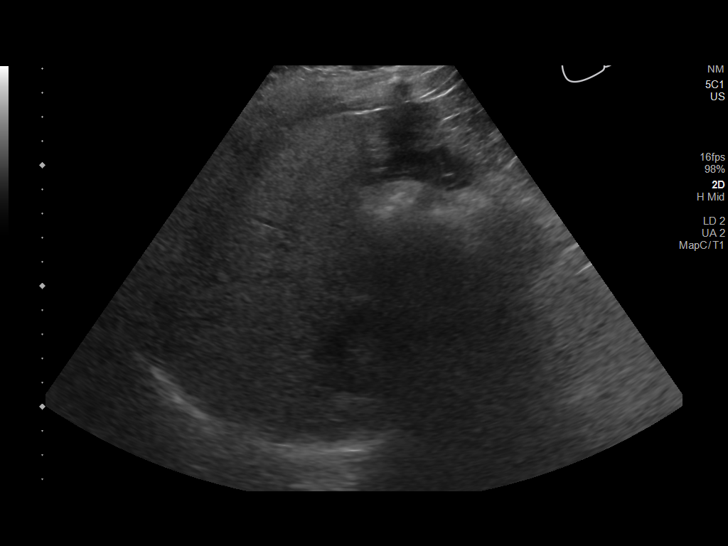
[im 51/61]
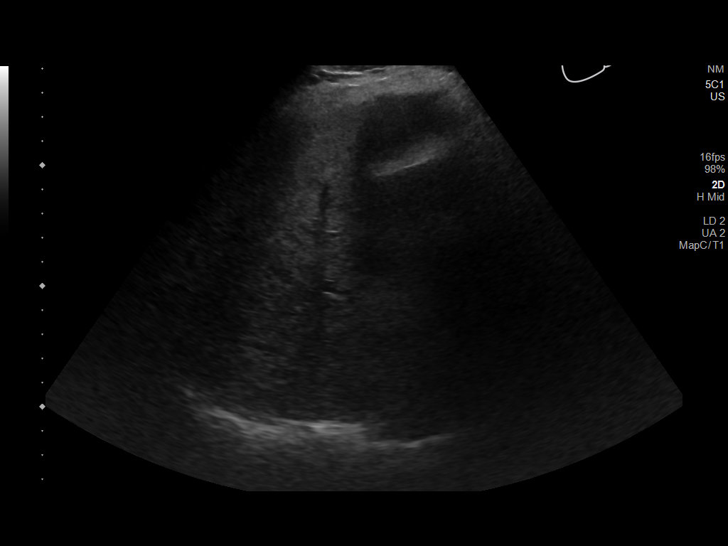
[im 56/61]
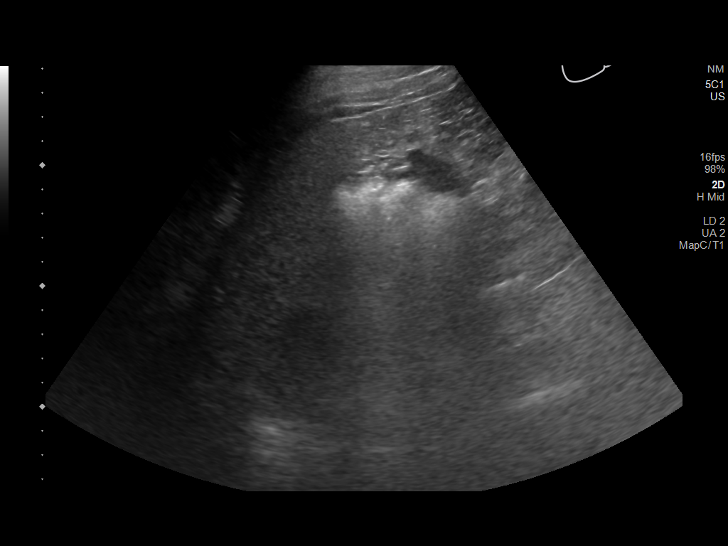
[im 61/61]
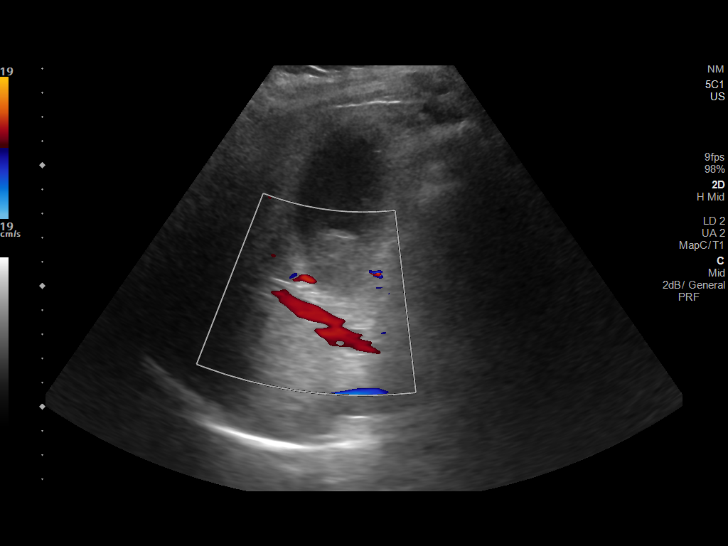

[14 of 25 positions shown; findings below may reference images not displayed]

FINDINGS: Gallbladder:

There is sludge and a 2 cm stone in the gallbladder. Gallbladder
wall is minimally thickened measuring 4 mm. There is trace
pericholecystic fluid. Negative sonographic Murphy's sign.
Evaluation for tenderness however is limited as the patient was
pre-medicated. The previously seen polyp is not visualized on
today's exam.

Common bile duct:

Diameter: 3 mm

Liver:

There is diffuse increased liver echogenicity most consistent with
fatty infiltration. Superimposed inflammation or fibrosis is not
excluded. Clinical correlation is recommended. Portal vein is patent
on color Doppler imaging with normal direction of blood flow towards
the liver.

Other: None.
IMPRESSION: 1. Cholelithiasis with findings of possible acute cholecystitis. A
hepatobiliary scintigraphy may provide better evaluation of the
gallbladder if there is a high clinical concern for acute
cholecystitis .
2. Fatty liver.

## 2022-09-05 DIAGNOSIS — R7303 Prediabetes: Secondary | ICD-10-CM | POA: Insufficient documentation

## 2022-09-05 HISTORY — DX: Prediabetes: R73.03

## 2022-09-27 ENCOUNTER — Emergency Department (HOSPITAL_BASED_OUTPATIENT_CLINIC_OR_DEPARTMENT_OTHER)
Admission: EM | Admit: 2022-09-27 | Discharge: 2022-09-27 | Disposition: A | Discharge status: Home / Self Care | Payer: Medicaid Other | Attending: Emergency Medicine | Admitting: Emergency Medicine

## 2022-09-27 ENCOUNTER — Other Ambulatory Visit: Payer: Self-pay

## 2022-09-27 ENCOUNTER — Encounter (HOSPITAL_BASED_OUTPATIENT_CLINIC_OR_DEPARTMENT_OTHER): Payer: Self-pay

## 2022-09-27 DIAGNOSIS — E860 Dehydration: Secondary | ICD-10-CM

## 2022-09-27 DIAGNOSIS — I471 Supraventricular tachycardia, unspecified: Secondary | ICD-10-CM | POA: Diagnosis not present

## 2022-09-27 DIAGNOSIS — Z7984 Long term (current) use of oral hypoglycemic drugs: Secondary | ICD-10-CM | POA: Diagnosis not present

## 2022-09-27 DIAGNOSIS — R000 Tachycardia, unspecified: Secondary | ICD-10-CM | POA: Diagnosis present

## 2022-09-27 LAB — CBC WITH DIFFERENTIAL/PLATELET
Abs Immature Granulocytes: 0.02 10*3/uL (ref 0.00–0.07)
Basophils Absolute: 0 10*3/uL (ref 0.0–0.1)
Basophils Relative: 0 %
Eosinophils Absolute: 0.1 10*3/uL (ref 0.0–0.5)
Eosinophils Relative: 2 %
HCT: 43.8 % (ref 36.0–46.0)
Hemoglobin: 15.6 g/dL — ABNORMAL HIGH (ref 12.0–15.0)
Immature Granulocytes: 0 %
Lymphocytes Relative: 23 %
Lymphs Abs: 1.5 10*3/uL (ref 0.7–4.0)
MCH: 30.3 pg (ref 26.0–34.0)
MCHC: 35.6 g/dL (ref 30.0–36.0)
MCV: 85 fL (ref 80.0–100.0)
Monocytes Absolute: 1 10*3/uL (ref 0.1–1.0)
Monocytes Relative: 14 %
Neutro Abs: 4.1 10*3/uL (ref 1.7–7.7)
Neutrophils Relative %: 61 %
Platelets: 261 10*3/uL (ref 150–400)
RBC: 5.15 MIL/uL — ABNORMAL HIGH (ref 3.87–5.11)
RDW: 12.1 % (ref 11.5–15.5)
WBC: 6.7 10*3/uL (ref 4.0–10.5)
nRBC: 0 % (ref 0.0–0.2)

## 2022-09-27 LAB — BASIC METABOLIC PANEL
Anion gap: 6 (ref 5–15)
BUN: 12 mg/dL (ref 6–20)
CO2: 20 mmol/L — ABNORMAL LOW (ref 22–32)
Calcium: 8.4 mg/dL — ABNORMAL LOW (ref 8.9–10.3)
Chloride: 106 mmol/L (ref 98–111)
Creatinine, Ser: 1.08 mg/dL — ABNORMAL HIGH (ref 0.44–1.00)
GFR, Estimated: >60 mL/min (ref >60)
Glucose, Bld: 207 mg/dL — ABNORMAL HIGH (ref 70–99)
Potassium: 3.3 mmol/L — ABNORMAL LOW (ref 3.5–5.1)
Sodium: 132 mmol/L — ABNORMAL LOW (ref 135–145)

## 2022-09-27 MED ORDER — ADENOSINE 6 MG/2ML IV SOLN
INTRAVENOUS | Status: AC
  Administered 2022-09-27: 6 mg
  Filled 2022-09-27: qty 6

## 2022-09-27 MED ORDER — SODIUM CHLORIDE 0.9 % IV BOLUS
1000.0000 mL | Freq: Once | INTRAVENOUS | Status: AC
  Administered 2022-09-27: 1000 mL via INTRAVENOUS

## 2022-09-27 NOTE — ED Provider Notes (Signed)
[facility] Provider Note   CSN: [id number] Arrival date & time: 09/27/22  0846     History  Chief Complaint  Patient presents with   Tachycardia    April Mcconnell is a 47 y.o. female.  47 yo F with a chief complaints of feeling unwell.  This started right did 7 this morning.  She felt like her heart did something funny and then since then has felt like she has been running a marathon.  She has had this happen to her before and has come to the Emergency Department and required cardioversion.  She was sick about 3 weeks ago.  She is currently on Bactrim for unknown rash to the right arm.        Home Medications Prior to Admission medications   Medication Sig Start Date End Date Taking? Authorizing Provider  amoxicillin-clavulanate (AUGMENTIN) 875-125 MG tablet Take 1 tablet by mouth every 12 (twelve) hours. 02/01/19   Jola Schmidt, MD  Dulaglutide (TRULICITY [place]) Inject into the skin.    [provider]  HYDROcodone-acetaminophen (NORCO/VICODIN) 5-325 MG tablet Take 1 tablet by mouth every 4 (four) hours as needed for moderate pain. 02/01/19   Jola Schmidt, MD  metFORMIN (GLUMETZA) 1000 MG (MOD) 24 hr tablet Take 1,000 mg by mouth 2 (two) times daily with a meal.    [provider]  ondansetron (ZOFRAN ODT) 4 MG disintegrating tablet Take 1 tablet (4 mg total) by mouth every 8 (eight) hours as needed for nausea or vomiting. 03/15/19   Horton, Barbette Hair, MD  ondansetron (ZOFRAN ODT) 8 MG disintegrating tablet Take 1 tablet (8 mg total) by mouth every 8 (eight) hours as needed for nausea or vomiting. 02/01/19   Jola Schmidt, MD  oxyCODONE-acetaminophen (PERCOCET/ROXICET) 5-325 MG tablet Take 1 tablet by mouth every 6 (six) hours as needed for severe pain. 03/15/19   Horton, Barbette Hair, MD      Allergies    Patient has no known allergies.    Review of Systems   Review of Systems  Physical Exam Updated Vital  Signs BP 97/65   Pulse 88   Temp 98.1 F (36.7 C) (Oral)   Resp 17   Ht '5\' 4"'$  (1.626 m)   Wt 74.8 kg   SpO2 100%   BMI 28.32 kg/m  Physical Exam Vitals and nursing note reviewed.  Constitutional:      General: She is not in acute distress.    Appearance: She is well-developed. She is not diaphoretic.  HENT:     Head: Normocephalic and atraumatic.  Eyes:     Pupils: Pupils are equal, round, and reactive to light.  Cardiovascular:     Rate and Rhythm: Regular rhythm. Tachycardia present.     Heart sounds: No murmur heard.    No friction rub. No gallop.  Pulmonary:     Effort: Pulmonary effort is normal.     Breath sounds: No wheezing or rales.  Abdominal:     General: There is no distension.     Palpations: Abdomen is soft.     Tenderness: There is no abdominal tenderness.  Musculoskeletal:        General: No tenderness.     Cervical back: Normal range of motion and neck supple.  Skin:    General: Skin is warm and dry.  Neurological:     Mental Status: She is alert and oriented to person, place, and time.  Psychiatric:  Behavior: Behavior normal.     ED Results / Procedures / Treatments   Labs (all labs ordered are listed, but only abnormal results are displayed) Labs Reviewed  CBC WITH DIFFERENTIAL/PLATELET - Abnormal; Notable for the following components:      Result Value   RBC 5.15 (*)    Hemoglobin 15.6 (*)    All other components within normal limits  BASIC METABOLIC PANEL - Abnormal; Notable for the following components:   Sodium 132 (*)    Potassium 3.3 (*)    CO2 20 (*)    Glucose, Bld 207 (*)    Creatinine, Ser 1.08 (*)    Calcium 8.4 (*)    All other components within normal limits    EKG EKG Interpretation  Date/Time:  Thursday September 27 2022 08:54:57 EST Ventricular Rate:  210 PR Interval:  132 QRS Duration: 79 QT Interval:  254 QTC Calculation: 475 R Axis:   145 Text Interpretation: Supraventricular tachycardia Ventricular  premature complex Right axis deviation Consider anterior infarct ST depression, probably rate related Otherwise no significant change Confirmed by Deno Etienne [phone]) on 09/27/2022 9:02:41 AM  Radiology No results found.  Procedures .Critical Care  Performed by: Deno Etienne, DO Authorized by: Deno Etienne, DO   Critical care provider statement:    Critical care time (minutes):  35   Critical care time was exclusive of:  Separately billable procedures and treating other patients   Critical care was time spent personally by me on the following activities:  Development of treatment plan with patient or surrogate, discussions with consultants, evaluation of patient's response to treatment, examination of patient, ordering and review of laboratory studies, ordering and review of radiographic studies, ordering and performing treatments and interventions, pulse oximetry, re-evaluation of patient's condition and review of old charts   Care discussed with: admitting provider   .Cardioversion  Date/Time: 09/27/2022 10:08 AM  Performed by: Deno Etienne, DO Authorized by: Deno Etienne, DO   Consent:    Consent obtained:  Verbal   Consent given by:  Patient   Risks discussed:  Death Universal protocol:    Patient identity confirmed:  Verbally with patient Pre-procedure details:    Cardioversion basis:  Emergent Post-procedure details:    Patient tolerance of procedure:  Tolerated well, no immediate complications Comments:     Converted with 6 mg of adenosine without issue.      Medications Ordered in ED Medications  adenosine (ADENOCARD) 6 MG/2ML injection (6 mg  Given 09/27/22 0917)  sodium chloride 0.9 % bolus 1,000 mL (0 mLs Intravenous Stopped 09/27/22 1000)    ED Course/ Medical Decision Making/ A&P                             Medical Decision Making Amount and/or Complexity of Data Reviewed Labs: ordered.   47 yo F with a chief complaints of feeling unwell.  Patient arrived in SVT  with rates into the 200s.  Her blood pressure was soft on arrival and she looked unwell.  She was given a bolus dose of adenosine with significant improvement of her symptoms.  Will give a bolus of IV fluids.  Basic blood work.  Observe in the ED.  Patient's heart rate is improved.  She seems mild heme concentrated, mild dehydration on blood work.  She feels much better.  Would like to go home.  PCP and cardiology follow-up.  12:19 PM:  I have discussed the diagnosis/risks/treatment  options with the patient.  Evaluation and diagnostic testing in the emergency department does not suggest an emergent condition requiring admission or immediate intervention beyond what has been performed at this time.  They will follow up with PCP, cards. We also discussed returning to the ED immediately if new or worsening sx occur. We discussed the sx which are most concerning (e.g., sudden worsening pain, fever, inability to tolerate by mouth) that necessitate immediate return. Medications administered to the patient during their visit and any new prescriptions provided to the patient are listed below.  Medications given during this visit Medications  adenosine (ADENOCARD) 6 MG/2ML injection (6 mg  Given 09/27/22 0917)  sodium chloride 0.9 % bolus 1,000 mL (0 mLs Intravenous Stopped 09/27/22 1000)     The patient appears reasonably screen and/or stabilized for discharge and I doubt any other medical condition or other [facility] requiring further screening, evaluation, or treatment in the ED at this time prior to discharge.          Final Clinical Impression(s) / ED Diagnoses Final diagnoses:  SVT (supraventricular tachycardia)  Dehydration    Rx / DC Orders ED Discharge Orders          Ordered    Ambulatory referral to Cardiology       Comments: If you have not heard from the Cardiology office within the next 72 hours please call [phone].   09/27/22 [place], [place], DO 09/27/22  1219

## 2022-09-27 NOTE — Discharge Instructions (Signed)
Eat and drink as well as you can for the next couple days.  Please follow-up with a cardiologist in the office.  Please return for recurrent or persistent symptoms.

## 2022-09-27 NOTE — ED Notes (Signed)
Ambulatory to bathroom. Gait steady. No complaints of palpitations, pain or shortness of breath. VSS.

## 2022-09-27 NOTE — Progress Notes (Signed)
Patient placed on ETCO2 monitor prior to Adenosine. ETCO2 25

## 2022-09-27 NOTE — ED Triage Notes (Signed)
"  Woke up at 7am with heart racing and shortness of breath. Feels like I ran a marathon. I had this happen before about a year ago and seen cardiologist and wore monitor and they said nothing was wrong" per pt

## 2022-10-11 ENCOUNTER — Encounter: Payer: Self-pay | Admitting: Cardiology

## 2022-10-11 ENCOUNTER — Ambulatory Visit: Payer: Medicaid Other | Attending: Cardiology | Admitting: Cardiology

## 2022-10-11 VITALS — BP 106/70 | HR 82 | Ht 64.0 in | Wt 165.0 lb

## 2022-10-11 DIAGNOSIS — I471 Supraventricular tachycardia, unspecified: Secondary | ICD-10-CM | POA: Insufficient documentation

## 2022-10-11 DIAGNOSIS — R7303 Prediabetes: Secondary | ICD-10-CM | POA: Diagnosis not present

## 2022-10-11 DIAGNOSIS — E119 Type 2 diabetes mellitus without complications: Secondary | ICD-10-CM | POA: Insufficient documentation

## 2022-10-11 DIAGNOSIS — R000 Tachycardia, unspecified: Secondary | ICD-10-CM | POA: Insufficient documentation

## 2022-10-11 MED ORDER — METOPROLOL SUCCINATE ER 25 MG PO TB24: 25.0000 mg | ORAL_TABLET | Freq: Every day | ORAL | 6 refills | Status: DC | PRN

## 2022-10-11 NOTE — Progress Notes (Signed)
Cardiology Consultation:    Date:  10/11/2022   ID:  April Mcconnell, DOB 04/03/1976, MRN [id number]  PCP:  Patient, No Pcp Per  Cardiologist:  Jenne Campus, MD   Referring MD: Deno Etienne, DO   No chief complaint on file. Palpitations  History of Present Illness:    April Mcconnell is a 47 y.o. female who is being seen today for the evaluation of SVT at the request of Deno Etienne, DO.  Past medical history significant for prediabetes.  Otherwise she is a healthy young woman she said since childhood she has been experiencing palpitations sudden onset sudden offset.  She had a lot of this palpitation when she was young and she even had a conversation about ablation when she was young person however it never happened likely this palpitation became much more frequent she end up being in the emergency room after hours of palpitations that was associated with weakness fatigue sensation of passing out but she never passed out, and matter-of-fact she drove to the emergency room herself.  She was find to have short RP interval tachycardia look like AV nodal reentry tachycardia, she was given adenosine and she converted to sinus rhythm since that time she is doing well.  She would not know how to do vagal maneuver and she was trying to do this maneuver herself however this time with did not stop.  Previously when she had episode of supraventricular tachycardia she was able to do Valsalva maneuver with success.  She does not have any other problems except borderline diabetes.  There is otherwise no issues.  She goes to gym with no difficulties  Past Medical History:  Diagnosis Date   Diabetes mellitus without complication ([place])    Prediabetes 09/05/2022   Tachycardia, unspecified     Past Surgical History:  Procedure Laterality Date   CHOLECYSTECTOMY      Current Medications: Current Meds  Medication Sig   Dulaglutide (TRULICITY ) Inject 3.5 mg into the skin once a week.    MAGNESIUM CITRATE PO Take 500 mg by mouth daily.   metFORMIN (GLUMETZA) 1000 MG (MOD) 24 hr tablet Take 1,000 mg by mouth 2 (two) times daily with a meal.   VITAMIN D PO Take 2,000 Units by mouth daily.     Allergies:   Patient has no known allergies.   Social History   Socioeconomic History   Marital status: Married    Spouse name: Not on file   Number of children: Not on file   Years of education: Not on file   Highest education level: Not on file  Occupational History   Not on file  Tobacco Use   Smoking status: Never   Smokeless tobacco: Never  Substance and Sexual Activity   Alcohol use: No   Drug use: No   Sexual activity: Not on file  Other Topics Concern   Not on file  Social History Narrative   Not on file   Social Determinants of Health   Financial Resource Strain: Not on file  Food Insecurity: Not on file  Transportation Needs: Not on file  Physical Activity: Not on file  Stress: Not on file  Social Connections: Not on file     Family History: The patient's family history includes Healthy in her mother; Other in her father. There is no history of Heart Problems. ROS:   Please see the history of present illness.    All 14 point review of systems negative except as described per  history of present illness.  EKGs/Labs/Other Studies Reviewed:    The following studies were reviewed today: I did review EKG from the hospital.  She does have short RP tachycardia look like AV dual reentry  EKG:  EKG is  ordered today.  The ekg ordered today demonstrates normal sinus rhythm normal P interval, poor R wave progression anterior precordium, possibly a left posterior hemiblock  Recent Labs: 09/27/2022: BUN 12; Creatinine, Ser 1.08; Hemoglobin 15.6; Platelets 261; Potassium 3.3; Sodium 132  Recent Lipid Panel No results found for: "CHOL", "TRIG", "HDL", "CHOLHDL", "VLDL", "LDLCALC", "LDLDIRECT"  Physical Exam:    VS:  BP 106/70 (BP Location: Right Arm, Patient  Position: Sitting)   Pulse 82   Ht '5\' 4"'$  (1.626 m)   Wt 165 lb (74.8 kg)   SpO2 99%   BMI 28.32 kg/m     Wt Readings from Last 3 Encounters:  10/11/22 165 lb (74.8 kg)  09/27/22 165 lb (74.8 kg)  03/15/19 171 lb (77.6 kg)     GEN:  Well nourished, well developed in no acute distress HEENT: Normal NECK: No JVD; No carotid bruits LYMPHATICS: No lymphadenopathy CARDIAC: RRR, no murmurs, no rubs, no gallops RESPIRATORY:  Clear to auscultation without rales, wheezing or rhonchi  ABDOMEN: Soft, non-tender, non-distended MUSCULOSKELETAL:  No edema; No deformity  SKIN: Warm and dry NEUROLOGIC:  Alert and oriented x 3 PSYCHIATRIC:  Normal affect   ASSESSMENT:    1. Diabetes mellitus without complication ([place])   2. Supraventricular tachycardia   3. 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  I presented her with options of medical therapy versus ablation.  She definitely prefers ablation.  She is tired of this arrhythmia.  Will refer her to our EP team.  In the meantime I will initiate metoprolol succinate 25 mg daily. Echocardiogram will be performed as a part of evaluation make sure her heart is structurally normal especially since she does have some nonspecific abnormality on the EKG. Prediabetes that being followed by antimedicine team.   Medication Adjustments/Labs and Tests Ordered: Current medicines are reviewed at length with the patient today.  Concerns regarding medicines are outlined above.  No orders of the defined types were placed in this encounter.  No orders of the defined types were placed in this encounter.   Signed, Park Liter, MD, [facility]. 10/11/2022 3:55 PM    [facility]

## 2022-10-11 NOTE — Patient Instructions (Addendum)
Medication Instructions:   Start: Metoprolol Succinate '25mg'$  Take 1 tablet daily as needed   Lab Work: None Ordered If you have labs (blood work) drawn today and your tests are completely normal, you will receive your results only by: MyChart Message (if you have MyChart) OR A paper copy in the mail If you have any lab test that is abnormal or we need to change your treatment, we will call you to review the results.   Testing/Procedures: Your physician has requested that you have an echocardiogram. Echocardiography is a painless test that uses sound waves to create images of your heart. It provides your doctor with information about the size and shape of your heart and how well your heart's chambers and valves are working. This procedure takes approximately one hour. There are no restrictions for this procedure. Please do NOT wear cologne, perfume, aftershave, or lotions (deodorant is allowed). Please arrive 15 minutes prior to your appointment time.    Follow-Up: At [facility], you and your health needs are our priority.  As part of our continuing mission to provide you with exceptional heart care, we have created designated Provider Care Teams.  These Care Teams include your primary Cardiologist (physician) and Advanced Practice Providers (APPs -  Physician Assistants and Nurse Practitioners) who all work together to provide you with the care you need, when you need it.  We recommend signing up for the patient portal called "MyChart".  Sign up information is provided on this After Visit Summary.  MyChart is used to connect with patients for Virtual Visits (Telemedicine).  Patients are able to view lab/test results, encounter notes, upcoming appointments, etc.  Non-urgent messages can be sent to your provider as well.   To learn more about what you can do with MyChart, go to [email or web address].    Your next appointment:   Follow up as needed after seeing Dr. Curt Bears  The format  for your next appointment:   In Person  Provider:   Jenne Campus, MD    Other Instructions Referral to Dr. Curt Bears - they will call for appt

## 2022-11-08 ENCOUNTER — Encounter: Payer: Self-pay | Admitting: *Deleted

## 2022-11-08 ENCOUNTER — Ambulatory Visit: Payer: Medicaid Other | Attending: Internal Medicine

## 2022-11-08 ENCOUNTER — Encounter: Payer: Self-pay | Admitting: Cardiology

## 2022-11-08 ENCOUNTER — Ambulatory Visit (INDEPENDENT_AMBULATORY_CARE_PROVIDER_SITE_OTHER): Payer: Medicaid Other | Admitting: Cardiology

## 2022-11-08 VITALS — BP 114/80 | HR 76 | Ht 64.0 in | Wt 169.0 lb

## 2022-11-08 DIAGNOSIS — Z01812 Encounter for preprocedural laboratory examination: Secondary | ICD-10-CM | POA: Insufficient documentation

## 2022-11-08 DIAGNOSIS — I471 Supraventricular tachycardia, unspecified: Secondary | ICD-10-CM | POA: Diagnosis present

## 2022-11-08 LAB — ECHOCARDIOGRAM COMPLETE
Area-P 1/2: 3.34 cm2
S' Lateral: 3.1 cm

## 2022-11-08 NOTE — Patient Instructions (Signed)
Medication Instructions:  Your physician recommends that you continue on your current medications as directed. Please refer to the Current Medication list given to you today.  *If you need a refill on your cardiac medications before your next appointment, please call your pharmacy*   Lab Work: None ordered If you have labs (blood work) drawn today and your tests are completely normal, you will receive your results only by: MyChart Message (if you have MyChart) OR A paper copy in the mail If you have any lab test that is abnormal or we need to change your treatment, we will call you to review the results.   Testing/Procedures: Your physician has recommended that you have an ablation. Catheter ablation is a medical procedure used to treat some cardiac arrhythmias (irregular heartbeats). During catheter ablation, a long, thin, flexible tube is put into a blood vessel in your groin (upper thigh), or neck. This tube is called an ablation catheter. It is then guided to your heart through the blood vessel. Radio frequency waves destroy small areas of heart tissue where abnormal heartbeats may cause an arrhythmia to start. Please see the instruction sheet given to you today.   Follow-Up: At [facility], you and your health needs are our priority.  As part of our continuing mission to provide you with exceptional heart care, we have created designated Provider Care Teams.  These Care Teams include your primary Cardiologist (physician) and Advanced Practice Providers (APPs -  Physician Assistants and Nurse Practitioners) who all work together to provide you with the care you need, when you need it.  Your next appointment:   4 week(s) after your ablation  The format for your next appointment:   In Person  Provider:   Will Camnitz, MD    Thank you for choosing [facility]!!   Satoshi Kalas, RN [phone]  Other Instructions  Cardiac Ablation Cardiac ablation is a procedure to  destroy (ablate) heart tissue that is sending bad signals. These bad signals cause the heart to beat very fast or in a way that is not normal. Destroying some tissues can help make the heart rhythm normal. Tell your doctor about: Any allergies you have. All medicines you are taking. These include vitamins, herbs, eye drops, creams, and over-the-counter medicines. Any problems you or family members have had with anesthesia. Any bleeding problems you have. Any surgeries you have had. Any medical conditions you have. Whether you are pregnant or may be pregnant. What are the risks? Your doctor will talk with you about risks. These may include: Infection. Bruising and bleeding. Stroke or blood clots. Damage to nearby areas of your body. Allergies to medicines or dyes. Needing a pacemaker if the heart gets damaged. A pacemaker helps the heart beat normally. The procedure not working. What happens before the procedure? Medicines Ask your doctor about changing or stopping: Your normal medicines. Vitamins, herbs, and supplements. Over-the-counter medicines. Do not take aspirin or ibuprofen unless you are told to. General instructions Follow instructions from your doctor about what you may eat and drink. If you will be going home right after the procedure, plan to have a responsible adult: Take you home from the hospital or clinic. You will not be allowed to drive. Care for you for the time you are told. Ask your doctor what steps will be taken to prevent the spread of germs. What happens during the procedure?  An IV tube will be put into one of your veins. You may be given: A   sedative. This helps you relax. Anesthesia. This will: Numb certain areas of your body. The skin on your neck or groin will be numbed. A cut (incision) will be made in your neck or groin. A needle will be put through the cut and into a large vein. The small, thin tube (catheter) will be put into the needle. The  tube will be moved to your heart. A type of X-ray (fluoroscopy) will be used to help guide the tube. It will also show constant images of the heart on a screen. Dye may be put through the tube. This helps your doctor see your heart. An electric current will be sent from the tube to destroy heart tissue in certain areas. The tube will be taken out. Pressure will be held on your cut. This helps stop bleeding. A bandage (dressing) will be put over your cut. The procedure may vary among doctors and hospitals. What happens after the procedure? You will be monitored until you leave the hospital or clinic. This includes checking your blood pressure, heart rate and rhythm, breathing rate, and blood oxygen level. Your cut will be checked for bleeding. You will need to lie still for a few hours. If your groin was used, you will need to keep your leg straight for a few hours after the small, thin tube is removed. This information is not intended to replace advice given to you by your health care provider. Make sure you discuss any questions you have with your health care provider. Document Revised: 01/02/2022 Document Reviewed: 01/02/2022 Elsevier Patient Education  2023 Elsevier Inc.          

## 2022-11-08 NOTE — Progress Notes (Signed)
Electrophysiology Office Note   Date:  11/08/2022   ID:  Shardasia Bevington, DOB 1975-10-27, MRN [id number]  PCP:  Patient, No Pcp Per  Cardiologist:  Bing Matter Primary Electrophysiologist:  Nial Hawe Jorja Loa, MD    Chief Complaint: SVT   History of Present Illness: April Mcconnell is a 47 y.o. female who is being seen today for the evaluation of SVT at the request of Georgeanna Lea, MD. Presenting today for electrophysiology evaluation.  She has a history of diabetes, SVT.  She has been experiencing multiple episodes of palpitations.  This has been occurring since she was a child.  She went to the emergency room with palpitations weakness and fatigue.  She did not have syncope.  She was found to have a short RP tachycardia.  She was given adenosine which converted her to sinus rhythm.  Today, she denies symptoms of palpitations, chest pain, shortness of breath, orthopnea, PND, lower extremity edema, claudication, dizziness, presyncope, syncope, bleeding, or neurologic sequela. The patient is tolerating medications without difficulties.  She has had 2 episodes of SVT this year.  She has tried vagal maneuvers which at times has worked.  She feels palpitations and chest discomfort as well as short of breath when she has these episodes.  In between these episodes, she is able to exercise without issue.   Past Medical History:  Diagnosis Date   Diabetes mellitus without complication    Prediabetes 09/05/2022   Tachycardia, unspecified    Past Surgical History:  Procedure Laterality Date   CHOLECYSTECTOMY       Current Outpatient Medications  Medication Sig Dispense Refill   Dulaglutide (TRULICITY [place]) Inject 3.5 mg into the skin once a week.     MAGNESIUM CITRATE PO Take 500 mg by mouth daily.     metFORMIN (GLUMETZA) 1000 MG (MOD) 24 hr tablet Take 500 mg by mouth 2 (two) times daily with a meal.     VITAMIN D PO Take 2,000 Units by mouth daily.     metoprolol  succinate (TOPROL XL) 25 MG 24 hr tablet Take 1 tablet (25 mg total) by mouth daily as needed. 30 tablet 6   oxyCODONE-acetaminophen (PERCOCET/ROXICET) 5-325 MG tablet Take 1 tablet by mouth every 6 (six) hours as needed for severe pain. (Patient not taking: Reported on 10/11/2022) 10 tablet 0   No current facility-administered medications for this visit.    Allergies:   Patient has no known allergies.   Social History:  The patient  reports that she has never smoked. She has never used smokeless tobacco. She reports that she does not drink alcohol and does not use drugs.   Family History:  The patient's family history includes Healthy in her mother; Other in her father.    ROS:  Please see the history of present illness.   Otherwise, review of systems is positive for none.   All other systems are reviewed and negative.    PHYSICAL EXAM: VS:  BP 114/80   Pulse 76   Ht 5\' 4"  (1.626 m)   Wt 169 lb (76.7 kg)   SpO2 98%   BMI 29.01 kg/m  , BMI Body mass index is 29.01 kg/m. GEN: Well nourished, well developed, in no acute distress  HEENT: normal  Neck: no JVD, carotid bruits, or masses Cardiac: RRR; no murmurs, rubs, or gallops,no edema  Respiratory:  clear to auscultation bilaterally, normal work of breathing GI: soft, nontender, nondistended, + BS MS: no deformity or atrophy  Skin: warm  and dry Neuro:  Strength and sensation are intact Psych: euthymic mood, full affect  EKG:  EKG is not ordered today. Personal review of the ekg ordered 10/11/22 shows sinus rhythm  Recent Labs: 09/27/2022: BUN 12; Creatinine, Ser 1.08; Hemoglobin 15.6; Platelets 261; Potassium 3.3; Sodium 132    Lipid Panel  No results found for: "CHOL", "TRIG", "HDL", "CHOLHDL", "VLDL", "LDLCALC", "LDLDIRECT"   Wt Readings from Last 3 Encounters:  11/08/22 169 lb (76.7 kg)  10/11/22 165 lb (74.8 kg)  09/27/22 165 lb (74.8 kg)      Other studies Reviewed: Additional studies/ records that were reviewed  today include: TTE 11/08/22  Review of the above records today demonstrates:   1. Left ventricular ejection fraction, by estimation, is 60 to 65%. The  left ventricle has normal function. The left ventricle has no regional  wall motion abnormalities. Left ventricular diastolic parameters were  normal.   2. Right ventricular systolic function is normal. The right ventricular  size is normal. Tricuspid regurgitation signal is inadequate for assessing  PA pressure.   3. No evidence of mitral valve regurgitation.   4. The aortic valve is grossly normal. Aortic valve regurgitation is not  visualized.   5. The inferior vena cava is normal in size with greater than 50%  respiratory variability, suggesting right atrial pressure of 3 mmHg.    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  Currently on metoprolol.  She would like to be off of medications for her SVT.  Due to that, we Kaaliyah Kita plan for ablation.  Risk and benefits were discussed and include bleeding, tamponade, infection, heart block, stroke, MI, kidney damage, death.  She understands the risks and is agreed to the procedure.  In the interim, we Leanna Hamid continue her metoprolol.  If she has further episodes of SVT, Oona Trammel likely need to increase the dose of metoprolol prior to ablation.  Tabor Denham have her hold her metoprolol 1 week prior to ablation.  Case discussed with primary cardiology  Current medicines are reviewed at length with the patient today.   The patient does not have concerns regarding her medicines.  The following changes were made today:  none  Labs/ tests ordered today include:  Orders Placed This Encounter  Procedures   Basic metabolic panel   CBC     Disposition:   FU with Hayleen Clinkscales 6 months  Signed, Deyjah Kindel Jorja Loa, MD  11/08/2022 4:46 PM     [facility] [address] [place] [address] [phone] (office) [phone] (fax)

## 2022-11-15 ENCOUNTER — Telehealth: Payer: Self-pay

## 2022-11-15 NOTE — Telephone Encounter (Signed)
-----   Message from Georgeanna Lea, MD sent at 11/08/2022  2:18 PM EDT ----- Echocardiogram showed normal left ventricle ejection fraction  Overall looks good

## 2022-11-15 NOTE — Telephone Encounter (Signed)
Patient notified through my chart.

## 2023-01-03 ENCOUNTER — Encounter: Payer: Self-pay | Admitting: *Deleted

## 2023-02-06 ENCOUNTER — Ambulatory Visit: Payer: Medicaid Other

## 2023-02-17 NOTE — Pre-Procedure Instructions (Signed)
Patient scheduled for procedure 7/31 with anesthesia.  Anesthesia requires certain medications to be held 7 days before procedure.  Trulicity- patient takes on Tuesdays, can take on 7/23, don't take on 7/30.  Can resume the following Tuesday 8/6.

## 2023-02-26 ENCOUNTER — Encounter (HOSPITAL_COMMUNITY): Payer: Self-pay | Admitting: Anesthesiology

## 2023-02-26 NOTE — Pre-Procedure Instructions (Signed)
Attempted to call patient regarding instructions for procedure tomorrow.  Left voicemail on the following items: Arrival time 1100 Nothing to eat or drink after midnight No meds AM of procedure Responsible person to drive you home and stay with you for 24 hrs

## 2023-02-27 ENCOUNTER — Encounter (HOSPITAL_COMMUNITY): Admission: RE | Payer: Self-pay | Source: Home / Self Care

## 2023-02-27 ENCOUNTER — Ambulatory Visit (HOSPITAL_COMMUNITY): Admission: RE | Admit: 2023-02-27 | Payer: Medicaid Other | Source: Home / Self Care | Admitting: Cardiology

## 2023-02-27 DIAGNOSIS — I471 Supraventricular tachycardia, unspecified: Secondary | ICD-10-CM

## 2023-02-27 SURGERY — SVT ABLATION
Anesthesia: General

## 2023-03-29 ENCOUNTER — Encounter: Payer: Self-pay | Admitting: Cardiology

## 2023-03-29 ENCOUNTER — Ambulatory Visit: Payer: Medicaid Other | Attending: Cardiology | Admitting: Cardiology

## 2023-03-29 VITALS — BP 118/74 | HR 72 | Ht 65.0 in | Wt 156.0 lb

## 2023-03-29 DIAGNOSIS — I471 Supraventricular tachycardia, unspecified: Secondary | ICD-10-CM

## 2023-03-29 NOTE — Progress Notes (Signed)
  Electrophysiology Office Note:   Date:  03/29/2023  ID:  April Mcconnell 10/28/1975, MRN [id number]  Primary Cardiologist: None Electrophysiologist: Cookie Pore Jorja Loa, MD      History of Present Illness:   April Mcconnell is a 47 y.o. female with h/o SVT seen today for routine electrophysiology followup.  Since last being seen in our clinic the patient reports doing well.  She has had a few episodes of SVT, but nothing that is prolonged.  She canceled her prior ablation.  She would like this to be rescheduled.  she denies chest pain, palpitations, dyspnea, PND, orthopnea, nausea, vomiting, dizziness, syncope, edema, weight gain, or early satiety.   Review of systems complete and found to be negative unless listed in HPI.   EP Information / Studies Reviewed:    EKG is ordered today. Personal review as below.  EKG Interpretation Date/Time:  Friday March 29 2023 11:41:18 EDT Ventricular Rate:  72 PR Interval:  156 QRS Duration:  86 QT Interval:  406 QTC Calculation: 444 R Axis:   72  Text Interpretation: Normal sinus rhythm Normal ECG When compared with ECG of 27-Sep-2022 08:54, Sinus rhythm Confirmed by Sarissa Dern ([id number]) on 03/29/2023 11:45:31 AM     Risk Assessment/Calculations:              Physical Exam:   VS:  BP 118/74   Pulse 72   Ht 5\' 5"  (1.651 m)   Wt 156 lb (70.8 kg)   SpO2 95%   BMI 25.96 kg/m    Wt Readings from Last 3 Encounters:  03/29/23 156 lb (70.8 kg)  11/08/22 169 lb (76.7 kg)  10/11/22 165 lb (74.8 kg)     GEN: Well nourished, well developed in no acute distress NECK: No JVD; No carotid bruits CARDIAC: Regular rate and rhythm, no murmurs, rubs, gallops RESPIRATORY:  Clear to auscultation without rales, wheezing or rhonchi  ABDOMEN: Soft, non-tender, non-distended EXTREMITIES:  No edema; No deformity   ASSESSMENT AND PLAN:    1.  SVT: Appears to be due to AVNRT.  Currently on metoprolol.  She is continue to have  episodes.  She would prefer ablation to continued medications.  Risk and benefits have been discussed.  She understands the risks and is agreed to the procedure.  Follow up with Dr. Elberta Fortis as usual post procedure  Signed, Kayston Jodoin Jorja Loa, MD

## 2023-04-16 ENCOUNTER — Telehealth: Payer: Self-pay

## 2023-04-16 DIAGNOSIS — I471 Supraventricular tachycardia, unspecified: Secondary | ICD-10-CM

## 2023-04-16 NOTE — Telephone Encounter (Signed)
Pt is scheduled for SVT Ablation with Dr. Elberta Fortis on 12/4 at 2:30 PM. She will come to [facility] for labs on 11/27.  Instruction letter will be sent via MyChart.. Pt takes Ozempic on Saturdays.Marland KitchenMarland Kitchen

## 2023-06-06 ENCOUNTER — Encounter: Payer: Self-pay | Admitting: Cardiology

## 2023-06-12 NOTE — Telephone Encounter (Signed)
Called pt. All questions related to ablation answered. Pt appreciates my call/information

## 2023-06-23 NOTE — Pre-Procedure Instructions (Signed)
Patient is having a procedure on 12/4 with anesthesia.  She takes her Ozempic on Sundays.  She will take dose today and hold until after procedure.

## 2023-06-26 ENCOUNTER — Ambulatory Visit: Payer: Medicaid Other

## 2023-06-26 DIAGNOSIS — I471 Supraventricular tachycardia, unspecified: Secondary | ICD-10-CM

## 2023-06-27 LAB — BASIC METABOLIC PANEL
BUN/Creatinine Ratio: 15 (ref 9–23)
BUN: 14 mg/dL (ref 6–24)
CO2: 24 mmol/L (ref 20–29)
Calcium: 9.7 mg/dL (ref 8.7–10.2)
Chloride: 105 mmol/L (ref 96–106)
Creatinine, Ser: 0.93 mg/dL (ref 0.57–1.00)
Glucose: 114 mg/dL — ABNORMAL HIGH (ref 70–99)
Potassium: 4.5 mmol/L (ref 3.5–5.2)
Sodium: 139 mmol/L (ref 134–144)
eGFR: 77 mL/min/{1.73_m2} (ref >59)

## 2023-06-27 LAB — CBC
Hematocrit: 43.5 % (ref 34.0–46.6)
Hemoglobin: 14.7 g/dL (ref 11.1–15.9)
MCH: 30.4 pg (ref 26.6–33.0)
MCHC: 33.8 g/dL (ref 31.5–35.7)
MCV: 90 fL (ref 79–97)
Platelets: 258 10*3/uL (ref 150–450)
RBC: 4.83 x10E6/uL (ref 3.77–5.28)
RDW: 12.1 % (ref 11.7–15.4)
WBC: 6.8 10*3/uL (ref 3.4–10.8)

## 2023-07-02 NOTE — Pre-Procedure Instructions (Signed)
Attempted to call patient regarding procedure instructions.  Left voicemail on  on the following items: Arrival time 1100 Nothing to eat or drink after midnight No meds AM of procedure Responsible person to drive you home and stay with you for 24 hrs  Should have been holding Ozempic for the last 7 ays.

## 2023-07-03 ENCOUNTER — Ambulatory Visit (HOSPITAL_COMMUNITY): Payer: Medicaid Other | Admitting: Anesthesiology

## 2023-07-03 ENCOUNTER — Encounter (HOSPITAL_COMMUNITY)
Admission: RE | Disposition: A | Discharge status: Home / Self Care | Payer: Self-pay | Source: Home / Self Care | Attending: Cardiology

## 2023-07-03 ENCOUNTER — Ambulatory Visit (HOSPITAL_COMMUNITY)
Admission: RE | Admit: 2023-07-03 | Discharge: 2023-07-03 | Disposition: A | Discharge status: Home / Self Care | Payer: Medicaid Other | Attending: Cardiology | Admitting: Cardiology

## 2023-07-03 ENCOUNTER — Other Ambulatory Visit: Payer: Self-pay

## 2023-07-03 DIAGNOSIS — I471 Supraventricular tachycardia, unspecified: Secondary | ICD-10-CM

## 2023-07-03 DIAGNOSIS — Z7985 Long-term (current) use of injectable non-insulin antidiabetic drugs: Secondary | ICD-10-CM | POA: Diagnosis not present

## 2023-07-03 DIAGNOSIS — E119 Type 2 diabetes mellitus without complications: Secondary | ICD-10-CM | POA: Diagnosis not present

## 2023-07-03 HISTORY — PX: SVT ABLATION: EP1225

## 2023-07-03 LAB — GLUCOSE, CAPILLARY
Glucose-Capillary: 110 mg/dL — ABNORMAL HIGH (ref 70–99)
Glucose-Capillary: 98 mg/dL (ref 70–99)

## 2023-07-03 LAB — PREGNANCY, URINE: Preg Test, Ur: NEGATIVE

## 2023-07-03 SURGERY — SVT ABLATION
Anesthesia: Monitor Anesthesia Care

## 2023-07-03 MED ORDER — KETAMINE HCL 10 MG/ML IJ SOLN
INTRAMUSCULAR | Status: DC | PRN
  Administered 2023-07-03: 10 mg via INTRAVENOUS

## 2023-07-03 MED ORDER — SODIUM CHLORIDE 0.9 % IV SOLN: INTRAVENOUS | Status: DC

## 2023-07-03 MED ORDER — KETAMINE HCL 50 MG/5ML IJ SOSY
PREFILLED_SYRINGE | INTRAMUSCULAR | Status: AC
  Filled 2023-07-03: qty 5

## 2023-07-03 MED ORDER — SODIUM CHLORIDE 0.9% FLUSH: 3.0000 mL | INTRAVENOUS | Status: DC | PRN

## 2023-07-03 MED ORDER — ACETAMINOPHEN 325 MG PO TABS
650.0000 mg | ORAL_TABLET | ORAL | Status: DC | PRN
Start: 2023-07-03 — End: 2023-07-03

## 2023-07-03 MED ORDER — PROPOFOL 10 MG/ML IV BOLUS
INTRAVENOUS | Status: DC | PRN
  Administered 2023-07-03: 20 mg via INTRAVENOUS

## 2023-07-03 MED ORDER — BUPIVACAINE HCL (PF) 0.25 % IJ SOLN
INTRAMUSCULAR | Status: DC | PRN
  Administered 2023-07-03: 60 mL

## 2023-07-03 MED ORDER — BUPIVACAINE HCL (PF) 0.25 % IJ SOLN
INTRAMUSCULAR | Status: AC
  Filled 2023-07-03: qty 30

## 2023-07-03 MED ORDER — PROPOFOL 500 MG/50ML IV EMUL
INTRAVENOUS | Status: DC | PRN
Start: 2023-07-03 — End: 2023-07-03
  Administered 2023-07-03: 70 ug/kg/min via INTRAVENOUS

## 2023-07-03 MED ORDER — FENTANYL CITRATE (PF) 250 MCG/5ML IJ SOLN
INTRAMUSCULAR | Status: DC | PRN
  Administered 2023-07-03 (×2): 50 ug via INTRAVENOUS

## 2023-07-03 MED ORDER — DEXAMETHASONE SODIUM PHOSPHATE 10 MG/ML IJ SOLN
INTRAMUSCULAR | Status: DC | PRN
  Administered 2023-07-03: 10 mg via INTRAVENOUS

## 2023-07-03 MED ORDER — PHENYLEPHRINE HCL-NACL 20-0.9 MG/250ML-% IV SOLN
INTRAVENOUS | Status: DC | PRN
  Administered 2023-07-03: 40 ug/min via INTRAVENOUS

## 2023-07-03 MED ORDER — SODIUM CHLORIDE 0.9 % IV SOLN: 250.0000 mL | INTRAVENOUS | Status: DC | PRN

## 2023-07-03 MED ORDER — ONDANSETRON HCL 4 MG/2ML IJ SOLN: 4.0000 mg | Freq: Four times a day (QID) | INTRAMUSCULAR | Status: DC | PRN

## 2023-07-03 MED ORDER — HEPARIN (PORCINE) IN NACL 1000-0.9 UT/500ML-% IV SOLN
INTRAVENOUS | Status: DC | PRN
  Administered 2023-07-03: 500 mL

## 2023-07-03 MED ORDER — ACETAMINOPHEN 500 MG PO TABS
1000.0000 mg | ORAL_TABLET | Freq: Once | ORAL | Status: AC
  Administered 2023-07-03: 1000 mg via ORAL
  Filled 2023-07-03: qty 2

## 2023-07-03 MED ORDER — PHENYLEPHRINE 80 MCG/ML (10ML) SYRINGE FOR IV PUSH (FOR BLOOD PRESSURE SUPPORT)
PREFILLED_SYRINGE | INTRAVENOUS | Status: DC | PRN
  Administered 2023-07-03 (×2): 80 ug via INTRAVENOUS

## 2023-07-03 MED ORDER — ONDANSETRON HCL 4 MG/2ML IJ SOLN
INTRAMUSCULAR | Status: DC | PRN
  Administered 2023-07-03: 4 mg via INTRAVENOUS

## 2023-07-03 SURGICAL SUPPLY — 12 items
CATH EZ STEER NAV 4MM D-F CUR (ABLATOR) IMPLANT
CATH JOSEPH QUAD ALLRED 6F REP (CATHETERS) IMPLANT
CATH WEBSTER BI DIR CS D-F CRV (CATHETERS) IMPLANT
CLOSURE MYNX CONTROL 6F/7F (Vascular Products) IMPLANT
COVER DOME SNAP 22 D (MISCELLANEOUS) IMPLANT
PACK EP LF (CUSTOM PROCEDURE TRAY) ×1 IMPLANT
PAD DEFIB RADIO PHYSIO CONN (PAD) ×1 IMPLANT
PATCH CARTO3 (PAD) IMPLANT
SHEATH PINNACLE 6F 10CM (SHEATH) IMPLANT
SHEATH PINNACLE 7F 10CM (SHEATH) IMPLANT
SHEATH PINNACLE 8F 10CM (SHEATH) IMPLANT
SHEATH PROBE COVER 6X72 (BAG) IMPLANT

## 2023-07-03 NOTE — Progress Notes (Signed)
Pt with Purewick in place, unable to void, refused in and out cath when 1st offered, but agreed to it with continued pressure in bladder, Dr. Elberta Fortis notified, order obtained  In/Out cath completed by this RN, 1st attempt unsuccessful, [place], NT at bedside, sterility maintained, tolerated well, 700cc of yellow urine noted for return

## 2023-07-03 NOTE — Transfer of Care (Signed)
Immediate Anesthesia Transfer of Care Note  Patient: April Mcconnell  Procedure(s) Performed: SVT ABLATION  Patient Location: PACU and Cath Lab  Anesthesia Type:MAC  Level of Consciousness: awake  Airway & Oxygen Therapy: Patient Spontanous Breathing  Post-op Assessment: Report given to RN  Post vital signs: Reviewed and stable  Last Vitals:  Vitals Value Taken Time  BP    Temp    Pulse    Resp    SpO2      Last Pain:  Vitals:   07/03/23 1154  PainSc: 0-No pain         Complications: There were no known notable events for this encounter.

## 2023-07-03 NOTE — Anesthesia Postprocedure Evaluation (Signed)
Anesthesia Post Note  Patient: April Mcconnell  Procedure(s) Performed: SVT ABLATION     Patient location during evaluation: Phase II Anesthesia Type: MAC Level of consciousness: awake and alert, patient cooperative and oriented Pain management: pain level controlled Vital Signs Assessment: post-procedure vital signs reviewed and stable Respiratory status: nonlabored ventilation, spontaneous breathing and respiratory function stable Cardiovascular status: blood pressure returned to baseline and stable Postop Assessment: no apparent nausea or vomiting, adequate PO intake and able to ambulate Anesthetic complications: no   There were no known notable events for this encounter.  Last Vitals:  Vitals:   07/03/23 1700 07/03/23 1802  BP: 97/70 99/64  Pulse: (!) 59 66  Resp: 10 10  Temp:    SpO2: 99% 99%    Last Pain:  Vitals:   07/03/23 1657  TempSrc:   PainSc: 0-No pain                 Kairie Vangieson,E. Mackson Botz

## 2023-07-03 NOTE — H&P (Signed)
  Electrophysiology Office Note:   Date:  07/03/2023  ID:  April Mcconnell, DOB [birth year], MRN [id number]  Primary Cardiologist: None Electrophysiologist: Neka Bise Jorja Loa, MD      History of Present Illness:   April Mcconnell is a 47 y.o. female with h/o SVT seen today for routine electrophysiology followup.   Today, denies symptoms of palpitations, chest pain, shortness of breath, orthopnea, PND, lower extremity edema, claudication, dizziness, presyncope, syncope, bleeding, or neurologic sequela. The patient is tolerating medications without difficulties. Plan SVT ablation today.   EP Information / Studies Reviewed:    EKG is ordered today. Personal review as below.        Risk Assessment/Calculations:              Physical Exam:   VS:  BP 107/66   Pulse 69   Temp 98.2 F (36.8 C)   Resp 18   Ht 5\' 5"  (1.651 m)   Wt 71.7 kg   LMP 06/04/2023 (Approximate)   SpO2 100%   BMI 26.29 kg/m    Wt Readings from Last 3 Encounters:  07/03/23 71.7 kg  03/29/23 70.8 kg  11/08/22 76.7 kg     GEN: No acute distress.   Neck: No JVD Cardiac: RRR, no murmurs, rubs, or gallops.  Respiratory: decreased BS bases bilaterally. GI: Soft, nontender, non-distended  MS: No edema; No deformity. Neuro:  Nonfocal  Skin: warm and dry Psych: Normal affect    ASSESSMENT AND PLAN:    1.  SVT: [name] has presented today for surgery, with the diagnosis of SVT.  The various methods of treatment have been discussed with the patient and family. After consideration of risks, benefits and other options for treatment, the patient has consented to  Procedure(s): Catheter ablation as a surgical intervention .  Risks include but not limited to complete heart block, stroke, esophageal damage, nerve damage, bleeding, vascular damage, tamponade, perforation, MI, and death. The patient's history has been reviewed, patient examined, no change in status, stable for surgery.  I  have reviewed the patient's chart and labs.  Questions were answered to the patient's satisfaction.    Marylen Zuk Elberta Fortis, MD 07/03/2023 12:14 PM

## 2023-07-03 NOTE — Discharge Instructions (Signed)

## 2023-07-03 NOTE — Anesthesia Preprocedure Evaluation (Addendum)
Anesthesia Evaluation  Patient identified by MRN, date of birth, ID band Patient awake    Reviewed: Allergy & Precautions, NPO status , Patient's Chart, lab work & pertinent test results  History of Anesthesia Complications Negative for: history of anesthetic complications  Airway Mallampati: II  TM Distance: >3 FB Neck ROM: Full    Dental  (+) Loose, Dental Advisory Given,    Pulmonary neg pulmonary ROS   Pulmonary exam normal breath sounds clear to auscultation       Cardiovascular + dysrhythmias Supra Ventricular Tachycardia  Rhythm:Regular Rate:Normal  10/2022 ECHO: EF 60-65%, normal LVF, normal RVF, no significant valvular abnormalities   Neuro/Psych negative neurological ROS     GI/Hepatic negative GI ROS, Neg liver ROS,,,  Endo/Other  diabetes  Ozempic: last 10d ago  Renal/GU      Musculoskeletal   Abdominal   Peds  Hematology negative hematology ROS (+)   Anesthesia Other Findings   Reproductive/Obstetrics                             Anesthesia Physical Anesthesia Plan  ASA: 2  Anesthesia Plan: MAC   Post-op Pain Management: Tylenol PO (pre-op)*   Induction:   PONV Risk Score and Plan: 3 and Ondansetron and Dexamethasone  Airway Management Planned: Natural Airway and Simple Face Mask  Additional Equipment: None  Intra-op Plan:   Post-operative Plan:   Informed Consent: I have reviewed the patients History and Physical, chart, labs and discussed the procedure including the risks, benefits and alternatives for the proposed anesthesia with the patient or authorized representative who has indicated his/her understanding and acceptance.     Dental advisory given  Plan Discussed with: CRNA and Surgeon  Anesthesia Plan Comments:        Anesthesia Quick Evaluation

## 2023-07-03 NOTE — Progress Notes (Signed)
Patient walked to the bathroom without difficulties. Bilateral groin sites, level 0, clean, dry, and intact.

## 2023-07-04 ENCOUNTER — Encounter (HOSPITAL_COMMUNITY): Payer: Self-pay | Admitting: Cardiology

## 2023-07-31 NOTE — Progress Notes (Signed)
  Electrophysiology Office Note:   Date:  08/01/2023  ID:  April Mcconnell, DOB [birth year], MRN [id number]  Primary Cardiologist: None Electrophysiologist: Will Gladis Norton, MD      History of Present Illness:   April Mcconnell is a 48 y.o. female with h/o SVT seen today for routine electrophysiology followup.   S/p ablation 07/03/23  Since last being seen in our clinic the patient reports doing well. Overall,  she denies chest pain, palpitations, dyspnea, PND, orthopnea, nausea, vomiting, dizziness, syncope, edema, weight gain, or early satiety.   Review of systems complete and found to be negative unless listed in HPI.   EP Information / Studies Reviewed:    EKG is ordered today. Personal review as below.  EKG Interpretation Date/Time:  Thursday August 01 2023 12:20:42 EST Ventricular Rate:  66 PR Interval:  156 QRS Duration:  88 QT Interval:  422 QTC Calculation: 442 R Axis:   113  Text Interpretation: Normal sinus rhythm Left posterior fascicular block When compared with ECG of 03-Jul-2023 15:46, No significant change was found Confirmed by Lesia Sharper [phone]) on 08/01/2023 12:22:48 PM    Arrhythmia History  AVNRT ablation 07/03/2023  Physical Exam:   VS:  LMP 06/04/2023 (Approximate)    Wt Readings from Last 3 Encounters:  07/03/23 158 lb (71.7 kg)  03/29/23 156 lb (70.8 kg)  11/08/22 169 lb (76.7 kg)     GEN: Well nourished, well developed in no acute distress NECK: No JVD; No carotid bruits CARDIAC: Regular rate and rhythm, no murmurs, rubs, gallops RESPIRATORY:  Clear to auscultation without rales, wheezing or rhonchi  ABDOMEN: Soft, non-tender, non-distended EXTREMITIES:  No edema; No deformity   ASSESSMENT AND PLAN:    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 Norton in 6 months  Signed, Sharper Prentice Lesia, PA-C

## 2023-08-01 ENCOUNTER — Encounter: Payer: Self-pay | Admitting: Student

## 2023-08-01 ENCOUNTER — Ambulatory Visit: Payer: Medicaid Other | Attending: Student | Admitting: Student

## 2023-08-01 VITALS — BP 112/60 | HR 66 | Ht 68.0 in | Wt 160.2 lb

## 2023-08-01 DIAGNOSIS — I471 Supraventricular tachycardia, unspecified: Secondary | ICD-10-CM | POA: Diagnosis not present

## 2023-08-01 NOTE — Patient Instructions (Signed)
 Medication Instructions:  Your physician recommends that you continue on your current medications as directed. Please refer to the Current Medication list given to you today.  *If you need a refill on your cardiac medications before your next appointment, please call your pharmacy*   Lab Work: None ordered.  If you have labs (blood work) drawn today and your tests are completely normal, you will receive your results only by: MyChart Message (if you have MyChart) OR A paper copy in the mail If you have any lab test that is abnormal or we need to change your treatment, we will call you to review the results.   Testing/Procedures: None ordered.    Follow-Up: At [facility], you and your health needs are our priority.  As part of our continuing mission to provide you with exceptional heart care, we have created designated Provider Care Teams.  These Care Teams include your primary Cardiologist (physician) and Advanced Practice Providers (APPs -  Physician Assistants and Nurse Practitioners) who all work together to provide you with the care you need, when you need it.  We recommend signing up for the patient portal called "MyChart".  Sign up information is provided on this After Visit Summary.  MyChart is used to connect with patients for Virtual Visits (Telemedicine).  Patients are able to view lab/test results, encounter notes, upcoming appointments, etc.  Non-urgent messages can be sent to your provider as well.   To learn more about what you can do with MyChart, go to [email or web address].    Your next appointment:   6 months

## 2024-03-10 NOTE — Telephone Encounter (Signed)
 Please schedule patient with MDS at [facility] . Orders in

## 2024-05-31 LAB — COLOGUARD: COLOGUARD: NEGATIVE

## 2024-06-10 NOTE — Progress Notes (Signed)
 SUBJECTIVE   Patient ID: April Mcconnell is a 48 y.o. (DOB [birth year]) female  Chief Complaint  Patient presents with   Follow-up    Chronic - Diabetes   New Patient  Diabetes Last A1C 5.7% Fasting BS 140-150 for the past month. Previously has been on Metformin and Trulicity and Ozempic. Has also noticed weight gain.   OBJECTIVE   Allergies[1]  Current Medications[2]  Problem List[3]  Medical History[4]    Surgical History[5]  Family History[6]  BP 113/67 (BP Location: Right arm, Patient Position: Sitting)   Pulse 80   Wt 78 kg (172 lb)   LMP 06/06/2024 (Exact Date)   SpO2 99%   BMI 28.40 kg/m   Review of Systems Systemic: Feeling fine.  No fever  and no chills. Head: No headache. Eyes: No vision problems. Cardiovascular: No chest pain or discomfort. Pulmonary: No dyspnea. Gastrointestinal: No abdominal pain. Psychological: No depression.   Constitutional: Oriented to person, place and time. Appears well-developed and well nourished.     ASSESSMENT/PLAN   1. Type 2 diabetes mellitus without complication, without long-term current use of insulin    (CMD)  Hemoglobin A1C With Estimated Average Glucose   POC Hemoglobin A1c      Patient Instructions  1. Type 2 diabetes mellitus without complication, without long-term current use of insulin    (CMD) (Primary) Controlled. Continue current regimen. If weight gain persists, could add back Ozempic. - Hemoglobin A1C With Estimated Average Glucose; Future - POC Hemoglobin A1c   Return in about 6 months (around 12/08/2024) for CPE/chronic, CPE labs/A1C/urine albumin.  Orders Placed This Encounter  Procedures   Hemoglobin A1C With Estimated Average Glucose   POC Hemoglobin A1c    Requested Prescriptions    No prescriptions requested or ordered in this encounter    Risks, benefits, and alternatives of the medication(s) and treatment plan(s) were discussed, and she expressed understanding.  Plan follow up as discussed or as needed. No barriers to treatment identified in this visit.            [1] No Known Allergies [2] Current Outpatient Medications  Medication Sig Dispense Refill   Accu-Chek Fastclix Lancet Drum misc USE DAILY AS DIRECTED     Accu-Chek Guide test strips test strip USE AS DIRECTED DAILY     clobetasoL (TEMOVATE) 0.05 % ointment Apply topically 2 (two) times a day as needed (rash/itching). 60 g 11   ergocalciferol, vitamin D2, (VITAMIN D2 ORAL) Take 2,000 Units by mouth daily.     magnesium citrate 100 mg tab Take 500 mg by mouth daily.     No current facility-administered medications for this visit.  [3] Patient Active Problem List Diagnosis   Supraventricular tachycardia (CMD)   Prediabetes   Diabetes mellitus without complication    (CMD)   Dermatitis  [4] Past Medical History: Diagnosis Date   Dermatitis 03/18/2023   Diabetes mellitus    Varicella   [5] Past Surgical History: Procedure Laterality Date   CHOLECYSTECTOMY     SKIN BIOPSY    [6] Family History Problem Relation Name Age of Onset   Hyperlipidemia Father     Diabetes Brother     Breast cancer Neg Hx     Ovarian cancer Neg Hx
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
## 2024-07-28 ENCOUNTER — Emergency Department (HOSPITAL_BASED_OUTPATIENT_CLINIC_OR_DEPARTMENT_OTHER)
Admission: EM | Admit: 2024-07-28 | Discharge: 2024-07-28 | Disposition: A | Discharge status: Home / Self Care | Payer: PRIVATE HEALTH INSURANCE | Attending: Emergency Medicine | Admitting: Emergency Medicine

## 2024-07-28 ENCOUNTER — Other Ambulatory Visit: Payer: Self-pay

## 2024-07-28 ENCOUNTER — Encounter (HOSPITAL_BASED_OUTPATIENT_CLINIC_OR_DEPARTMENT_OTHER): Payer: Self-pay

## 2024-07-28 ENCOUNTER — Emergency Department (HOSPITAL_BASED_OUTPATIENT_CLINIC_OR_DEPARTMENT_OTHER): Payer: PRIVATE HEALTH INSURANCE

## 2024-07-28 DIAGNOSIS — E119 Type 2 diabetes mellitus without complications: Secondary | ICD-10-CM | POA: Diagnosis not present

## 2024-07-28 DIAGNOSIS — M546 Pain in thoracic spine: Secondary | ICD-10-CM | POA: Insufficient documentation

## 2024-07-28 DIAGNOSIS — Y92410 Unspecified street and highway as the place of occurrence of the external cause: Secondary | ICD-10-CM | POA: Diagnosis not present

## 2024-07-28 MED ORDER — CYCLOBENZAPRINE HCL 10 MG PO TABS: 10.0000 mg | ORAL_TABLET | Freq: Two times a day (BID) | ORAL | 0 refills | Status: AC | PRN

## 2024-07-28 MED ORDER — CYCLOBENZAPRINE HCL 5 MG PO TABS
5.0000 mg | ORAL_TABLET | Freq: Once | ORAL | Status: AC
  Administered 2024-07-28: 5 mg via ORAL
  Filled 2024-07-28: qty 1

## 2024-07-28 NOTE — Discharge Instructions (Addendum)
 Imaging and exam are reassuring today.  I have sent in a muscle relaxer to help with back pain.  Please also use Tylenol  and ibuprofen as needed for pain relief.  Monitor for any worsening symptoms including shortness of breath, significant pain not managed with over-the-counter medications, dizziness, visual disturbances.  If any of these or other concerning symptoms arise please return to the emergency department for further evaluation.
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
## 2024-07-28 NOTE — ED Notes (Signed)
 Patient transported to X-ray

## 2024-07-28 NOTE — ED Notes (Signed)
